# Patient Record
Sex: Female | Born: 1970 | Race: Black or African American | Hispanic: No | Marital: Married | Smoking: Never smoker
Health system: Southern US, Community
[De-identification: ages and names within clinical notes are randomized; demographics above are authoritative.]

## PROBLEM LIST (undated history)

## (undated) DIAGNOSIS — I10 Essential (primary) hypertension: Secondary | ICD-10-CM

## (undated) HISTORY — PX: UTERINE FIBROID SURGERY: SHX826

---

## 2012-10-30 ENCOUNTER — Emergency Department: Payer: Self-pay | Admitting: Emergency Medicine

## 2014-05-28 DIAGNOSIS — R5383 Other fatigue: Secondary | ICD-10-CM | POA: Insufficient documentation

## 2014-05-28 DIAGNOSIS — R42 Dizziness and giddiness: Secondary | ICD-10-CM | POA: Insufficient documentation

## 2014-05-28 DIAGNOSIS — H8113 Benign paroxysmal vertigo, bilateral: Secondary | ICD-10-CM | POA: Insufficient documentation

## 2014-05-28 DIAGNOSIS — G44309 Post-traumatic headache, unspecified, not intractable: Secondary | ICD-10-CM | POA: Insufficient documentation

## 2014-07-10 ENCOUNTER — Ambulatory Visit: Payer: Managed Care, Other (non HMO) | Admitting: Physical Therapy

## 2014-07-28 ENCOUNTER — Encounter: Payer: Self-pay | Admitting: Physical Therapy

## 2018-01-16 ENCOUNTER — Encounter: Payer: Self-pay | Admitting: Emergency Medicine

## 2018-01-16 ENCOUNTER — Emergency Department: Payer: BLUE CROSS/BLUE SHIELD

## 2018-01-16 ENCOUNTER — Emergency Department
Admission: EM | Admit: 2018-01-16 | Discharge: 2018-01-16 | Disposition: A | Discharge status: Home / Self Care | Payer: BLUE CROSS/BLUE SHIELD | Attending: Emergency Medicine | Admitting: Emergency Medicine

## 2018-01-16 DIAGNOSIS — J069 Acute upper respiratory infection, unspecified: Secondary | ICD-10-CM

## 2018-01-16 DIAGNOSIS — Z79899 Other long term (current) drug therapy: Secondary | ICD-10-CM | POA: Diagnosis not present

## 2018-01-16 DIAGNOSIS — R0602 Shortness of breath: Secondary | ICD-10-CM | POA: Diagnosis present

## 2018-01-16 LAB — COMPREHENSIVE METABOLIC PANEL
ALT: 16 U/L (ref 0–44)
AST: 21 U/L (ref 15–41)
Albumin: 3.7 g/dL (ref 3.5–5.0)
Alkaline Phosphatase: 83 U/L (ref 38–126)
Anion gap: 8 (ref 5–15)
BUN: 9 mg/dL (ref 6–20)
CO2: 23 mmol/L (ref 22–32)
Calcium: 8.5 mg/dL — ABNORMAL LOW (ref 8.9–10.3)
Chloride: 105 mmol/L (ref 98–111)
Creatinine, Ser: 0.64 mg/dL (ref 0.44–1.00)
GFR calc Af Amer: >60 mL/min (ref >60)
GFR calc non Af Amer: >60 mL/min (ref >60)
Glucose, Bld: 112 mg/dL — ABNORMAL HIGH (ref 70–99)
Potassium: 3.5 mmol/L (ref 3.5–5.1)
SODIUM: 136 mmol/L (ref 135–145)
Total Bilirubin: 0.5 mg/dL (ref 0.3–1.2)
Total Protein: 7.9 g/dL (ref 6.5–8.1)

## 2018-01-16 LAB — CBC
HCT: 31.8 % — ABNORMAL LOW (ref 36.0–46.0)
Hemoglobin: 9.7 g/dL — ABNORMAL LOW (ref 12.0–15.0)
MCH: 20 pg — ABNORMAL LOW (ref 26.0–34.0)
MCHC: 30.5 g/dL (ref 30.0–36.0)
MCV: 65.7 fL — ABNORMAL LOW (ref 80.0–100.0)
Platelets: 340 10*3/uL (ref 150–400)
RBC: 4.84 MIL/uL (ref 3.87–5.11)
RDW: 18.2 % — ABNORMAL HIGH (ref 11.5–15.5)
WBC: 8 10*3/uL (ref 4.0–10.5)
nRBC: 0 % (ref 0.0–0.2)

## 2018-01-16 MED ORDER — IPRATROPIUM-ALBUTEROL 0.5-2.5 (3) MG/3ML IN SOLN
3.0000 mL | Freq: Once | RESPIRATORY_TRACT | Status: AC
  Administered 2018-01-16: 3 mL via RESPIRATORY_TRACT
  Filled 2018-01-16: qty 3

## 2018-01-16 MED ORDER — PREDNISONE 50 MG PO TABS: 50.0000 mg | ORAL_TABLET | Freq: Every day | ORAL | 0 refills | Status: DC

## 2018-01-16 NOTE — ED Triage Notes (Signed)
Pt c/o SHOB for past 3 weeks that worst when laying flat. Denies any swelling or pain.  Does have a coarse breath sound noted to posterior left upper lobe.  Unlabored currently.  Some DOE.  Has had some drainage dripping down per pt.  No fevers.  Has had problem in past and needed nebulizer here per pt.

## 2018-01-16 NOTE — ED Notes (Signed)
Patient ambulatory to xray at this time.

## 2018-01-16 NOTE — ED Notes (Signed)
Patient states she has felt short of breath for 2 weeks, caused by drainage to the back of her throat.  Patient states she has had a cough but it is not productive.  Patient reports high anxiety and frequent panic attacks, particularly caused by the feeling she can't breathe.

## 2018-01-16 NOTE — ED Provider Notes (Signed)
[facility] Emergency Department Provider Note   ____________________________________________    I have reviewed the triage vital signs and the nursing notes.   HISTORY  Chief Complaint Shortness of Breath     HPI Martha Decker is a 48 y.o. female who presents with complaints of shortness of breath.  Patient reports she is having nasal drainage that she feels is "clogging her throat ".  She reports she had to sit up multiple times last night because difficulty breathing.  She has had cough for nearly a week.  No chest pain.  Feels much better now.  No calf pain swelling.  No diaphoresis nausea or vomiting.  She reports she has been using a dehumidifier  No past medical history on file.  There are no active problems to display for this patient.     Prior to Admission medications   Medication Sig Start Date End Date Taking? Authorizing Provider  albuterol (VENTOLIN HFA) 108 (90 Base) MCG/ACT inhaler Inhale 1 puff into the lungs every 6 (six) hours as needed. 03/23/14  Yes [provider]  Fexofenadine-Pseudoephedrine (ALLEGRA-D 24 HOUR PO) Take 1 tablet by mouth daily.   Yes [provider]  naproxen (NAPROSYN) 500 MG tablet Take 500 mg by mouth 2 (two) times daily with a meal. 01/02/18  Yes [provider]  Pseudoephedrine-Guaifenesin [phone] MG TB12 Take 1 tablet by mouth every 12 (twelve) hours.   Yes [provider]  predniSONE (DELTASONE) 50 MG tablet Take 1 tablet (50 mg total) by mouth daily with breakfast. 01/16/18   Lavonia Drafts, MD     Allergies Patient has no known allergies.  No family history on file.  Social History Social History   Tobacco Use  . Smoking status: Never Smoker  . Smokeless tobacco: Never Used  Substance Use Topics  . Alcohol use: Never    Frequency: Never  . Drug use: Never    Review of Systems  Constitutional: No fever/chills Eyes: No visual changes.  ENT: As  above Cardiovascular: Denies chest pain. Respiratory: As above Gastrointestinal: No abdominal pain.   Genitourinary: Negative for dysuria. Musculoskeletal: Negative for back pain. Skin: Negative for rash. Neurological: Negative for headaches or weakness   ____________________________________________   PHYSICAL EXAM:  VITAL SIGNS: ED Triage Vitals  Enc Vitals Group     BP 01/16/18 0952 (!) 149/105     Pulse Rate 01/16/18 0952 83     Resp 01/16/18 0952 20     Temp 01/16/18 0952 98.3 F (36.8 C)     Temp Source 01/16/18 0952 Oral     SpO2 01/16/18 0952 100 %     Weight 01/16/18 0951 99.8 kg (220 lb)     Height 01/16/18 0951 1.626 m (5\' 4" )     Head Circumference --      Peak Flow --      Pain Score 01/16/18 0954 0     Pain Loc --      Pain Edu? --      Excl. in [place]? --     Constitutional: Alert and oriented.  Eyes: Conjunctivae are normal.   Nose: No congestion/rhinnorhea. Mouth/Throat: Mucous membranes are moist.    Cardiovascular: Normal rate, regular rhythm. Grossly normal heart sounds.  Good peripheral circulation. Respiratory: Normal respiratory effort.  No retractions.  Gastrointestinal: Soft and nontender. No distention.    Musculoskeletal: No lower extremity tenderness nor edema.  Warm and well perfused Neurologic:  Normal speech and language. No gross  focal neurologic deficits are appreciated.  Skin:  Skin is warm, dry and intact. No rash noted. Psychiatric: Mood and affect are normal. Speech and behavior are normal.  ____________________________________________   LABS (all labs ordered are listed, but only abnormal results are displayed)  Labs Reviewed  CBC - Abnormal; Notable for the following components:      Result Value   Hemoglobin 9.7 (*)    HCT 31.8 (*)    MCV 65.7 (*)    MCH 20.0 (*)    RDW 18.2 (*)    All other components within normal limits  COMPREHENSIVE METABOLIC PANEL - Abnormal; Notable for the following components:   Glucose, Bld  112 (*)    Calcium 8.5 (*)    All other components within normal limits   ____________________________________________  EKG  ED ECG REPORT I, Lavonia Drafts, the attending physician, personally viewed and interpreted this ECG.  Date: 01/16/2018  Rhythm: normal sinus rhythm QRS Axis: normal Intervals: normal ST/T Wave abnormalities: normal Narrative Interpretation: no evidence of acute ischemia  ____________________________________________  RADIOLOGY  Chest x-ray unremarkable ____________________________________________   PROCEDURES  Procedure(s) performed: No  Procedures   Critical Care performed: No ____________________________________________   INITIAL IMPRESSION / ASSESSMENT AND PLAN / ED COURSE  Pertinent labs & imaging results that were available during my care of the patient were reviewed by me and considered in my medical decision making (see chart for details).  Patient presents with likely nasal/sinus drainage causing her shortness of breath while lying supine.  She is much better now.  Strongly recommended using a humidifier as opposed to a dehumidifier, will give steroids that she reports this is helped her in the past.    ____________________________________________   FINAL CLINICAL IMPRESSION(S) / ED DIAGNOSES  Final diagnoses:  Viral upper respiratory tract infection        Note:  This document was prepared using Dragon voice recognition software and may include unintentional dictation errors.   Lavonia Drafts, MD 01/16/18 1540

## 2018-07-15 ENCOUNTER — Emergency Department
Admission: EM | Admit: 2018-07-15 | Discharge: 2018-07-15 | Disposition: A | Discharge status: Home / Self Care | Payer: BC Managed Care – PPO | Attending: Emergency Medicine | Admitting: Emergency Medicine

## 2018-07-15 ENCOUNTER — Emergency Department: Payer: BC Managed Care – PPO

## 2018-07-15 ENCOUNTER — Encounter: Payer: Self-pay | Admitting: Emergency Medicine

## 2018-07-15 ENCOUNTER — Other Ambulatory Visit: Payer: Self-pay

## 2018-07-15 DIAGNOSIS — R110 Nausea: Secondary | ICD-10-CM | POA: Diagnosis not present

## 2018-07-15 DIAGNOSIS — N390 Urinary tract infection, site not specified: Secondary | ICD-10-CM | POA: Insufficient documentation

## 2018-07-15 DIAGNOSIS — I10 Essential (primary) hypertension: Secondary | ICD-10-CM | POA: Insufficient documentation

## 2018-07-15 DIAGNOSIS — H81399 Other peripheral vertigo, unspecified ear: Secondary | ICD-10-CM | POA: Diagnosis not present

## 2018-07-15 DIAGNOSIS — R079 Chest pain, unspecified: Secondary | ICD-10-CM | POA: Diagnosis not present

## 2018-07-15 DIAGNOSIS — R42 Dizziness and giddiness: Secondary | ICD-10-CM | POA: Diagnosis present

## 2018-07-15 HISTORY — DX: Essential (primary) hypertension: I10

## 2018-07-15 LAB — URINALYSIS, COMPLETE (UACMP) WITH MICROSCOPIC
Bacteria, UA: NONE SEEN
Bilirubin Urine: NEGATIVE
Glucose, UA: NEGATIVE mg/dL
Hgb urine dipstick: NEGATIVE
Ketones, ur: NEGATIVE mg/dL
Nitrite: NEGATIVE
Protein, ur: NEGATIVE mg/dL
Specific Gravity, Urine: 1.013 (ref 1.005–1.030)
pH: 7 (ref 5.0–8.0)

## 2018-07-15 LAB — TROPONIN I (HIGH SENSITIVITY): Troponin I (High Sensitivity): <2 ng/L (ref <18)

## 2018-07-15 LAB — CBC
HCT: 33.9 % — ABNORMAL LOW (ref 36.0–46.0)
Hemoglobin: 10.3 g/dL — ABNORMAL LOW (ref 12.0–15.0)
MCH: 20.5 pg — ABNORMAL LOW (ref 26.0–34.0)
MCHC: 30.4 g/dL (ref 30.0–36.0)
MCV: 67.5 fL — ABNORMAL LOW (ref 80.0–100.0)
Platelets: 356 10*3/uL (ref 150–400)
RBC: 5.02 MIL/uL (ref 3.87–5.11)
RDW: 19.8 % — ABNORMAL HIGH (ref 11.5–15.5)
WBC: 6.8 10*3/uL (ref 4.0–10.5)
nRBC: 0 % (ref 0.0–0.2)

## 2018-07-15 LAB — BASIC METABOLIC PANEL
Anion gap: 7 (ref 5–15)
BUN: 10 mg/dL (ref 6–20)
CO2: 24 mmol/L (ref 22–32)
Calcium: 8.9 mg/dL (ref 8.9–10.3)
Chloride: 106 mmol/L (ref 98–111)
Creatinine, Ser: 0.58 mg/dL (ref 0.44–1.00)
GFR calc Af Amer: >60 mL/min (ref >60)
GFR calc non Af Amer: >60 mL/min (ref >60)
Glucose, Bld: 131 mg/dL — ABNORMAL HIGH (ref 70–99)
Potassium: 3.4 mmol/L — ABNORMAL LOW (ref 3.5–5.1)
Sodium: 137 mmol/L (ref 135–145)

## 2018-07-15 LAB — PREGNANCY, URINE: Preg Test, Ur: NEGATIVE

## 2018-07-15 MED ORDER — CEPHALEXIN 500 MG PO CAPS: 500.0000 mg | ORAL_CAPSULE | Freq: Two times a day (BID) | ORAL | 0 refills | Status: AC

## 2018-07-15 MED ORDER — MECLIZINE HCL 25 MG PO TABS: 25.0000 mg | ORAL_TABLET | Freq: Three times a day (TID) | ORAL | 0 refills | Status: AC | PRN

## 2018-07-15 NOTE — Discharge Instructions (Signed)
Take the Keflex (antibiotic) as prescribed for the next week for the urinary tract infection.  You can take the meclizine as needed for the dizziness symptoms.  Return to the ER for new, worsening, persistent severe dizziness, headache, weakness or numbness, difficulty with speech or with walking, vision changes, high fever, vomiting, worsening urinary symptoms or any other new or worsening symptoms that concern you.

## 2018-07-15 NOTE — ED Provider Notes (Signed)
[facility] Emergency Department Provider Note ____________________________________________   First MD Initiated Contact with Patient 07/15/18 1514     (approximate)  I have reviewed the triage vital signs and the nursing notes.   HISTORY  Chief Complaint Dizziness and Chest Pain    HPI Martha Decker is a 48 y.o. female with PMH as noted below who presents with dizziness over approximately last 2 weeks, intermittent, mainly occurring in the morning when she gets up, and when she changes positions during the night.  It is worse with turning her head to the right.  It is described as spinning and is associated with nausea.  It subsides after a few minutes.  Patient denies any prior history of this and has not taken anything for it.  In addition over the last few days she has had some intermittent pain to her left upper chest which is also positional and relieved when lying on her left side.  She denies associated shortness of breath, leg swelling, fever, cough, or vomiting.  She does report some right lower back pain but no urinary symptoms.  Past Medical History:  Diagnosis Date  . Hypertension     There are no active problems to display for this patient.   Past Surgical History:  Procedure Laterality Date  . UTERINE FIBROID SURGERY      Prior to Admission medications   Medication Sig Start Date End Date Taking? Authorizing Provider  albuterol (VENTOLIN HFA) 108 (90 Base) MCG/ACT inhaler Inhale 1 puff into the lungs every 6 (six) hours as needed. 03/23/14   [provider]  cephALEXin (KEFLEX) 500 MG capsule Take 1 capsule (500 mg total) by mouth 2 (two) times daily for 7 days. 07/15/18 07/22/18  Arta Silence, MD  Fexofenadine-Pseudoephedrine (ALLEGRA-D 24 HOUR PO) Take 1 tablet by mouth daily.    [provider]  meclizine (ANTIVERT) 25 MG tablet Take 1 tablet (25 mg total) by mouth 3 (three) times daily as needed for dizziness.  07/15/18   Arta Silence, MD  naproxen (NAPROSYN) 500 MG tablet Take 500 mg by mouth 2 (two) times daily with a meal. 01/02/18   [provider]  predniSONE (DELTASONE) 50 MG tablet Take 1 tablet (50 mg total) by mouth daily with breakfast. 01/16/18   Lavonia Drafts, MD  Pseudoephedrine-Guaifenesin [phone] MG TB12 Take 1 tablet by mouth every 12 (twelve) hours.    [provider]    Allergies Patient has no known allergies.  No family history on file.  Social History Social History   Tobacco Use  . Smoking status: Never Smoker  . Smokeless tobacco: Never Used  Substance Use Topics  . Alcohol use: Never    Frequency: Never  . Drug use: Never    Review of Systems  Constitutional: No fever/chills. Eyes: No redness. ENT: No sore throat. Cardiovascular: Positive for chest pain. Respiratory: Denies shortness of breath. Gastrointestinal: No vomiting or diarrhea. Genitourinary: Positive for dysuria.  Musculoskeletal: Positive for back pain. Skin: Negative for rash. Neurological: Negative for headache.   ____________________________________________   PHYSICAL EXAM:  VITAL SIGNS: ED Triage Vitals [07/15/18 1337]  Enc Vitals Group     BP (!) 158/88     Pulse Rate 89     Resp 18     Temp 98.1 F (36.7 C)     Temp Source Oral     SpO2 99 %     Weight 228 lb (103.4 kg)     Height 5\' 4"  (  1.626 m)     Head Circumference      Peak Flow      Pain Score 8     Pain Loc      Pain Edu?      Excl. in [place]?     Constitutional: Alert and oriented. Well appearing and in no acute distress. Eyes: Conjunctivae are normal.  EOMI.  PERRLA. Head: Atraumatic.  Bilateral TMs normal. Nose: No congestion/rhinnorhea. Mouth/Throat: Mucous membranes are moist.   Neck: Normal range of motion.  Cardiovascular: Normal rate, regular rhythm. Grossly normal heart sounds.  Good peripheral circulation. Respiratory: Normal respiratory effort.  No retractions. Lungs CTAB.  Gastrointestinal: No distention.  Genitourinary: No CVA tenderness. Musculoskeletal: No lower extremity edema.  Extremities warm and well perfused.  Neurologic:  Normal speech and language.  Motor and sensory intact in all extremities.  Normal coordination with no ataxia on finger-to-nose.  No pronator drift.  Cranial nerves grossly intact. Skin:  Skin is warm and dry. No rash noted. Psychiatric: Mood and affect are normal. Speech and behavior are normal.  ____________________________________________   LABS (all labs ordered are listed, but only abnormal results are displayed)  Labs Reviewed  BASIC METABOLIC PANEL - Abnormal; Notable for the following components:      Result Value   Potassium 3.4 (*)    Glucose, Bld 131 (*)    All other components within normal limits  CBC - Abnormal; Notable for the following components:   Hemoglobin 10.3 (*)    HCT 33.9 (*)    MCV 67.5 (*)    MCH 20.5 (*)    RDW 19.8 (*)    All other components within normal limits  URINALYSIS, COMPLETE (UACMP) WITH MICROSCOPIC - Abnormal; Notable for the following components:   Color, Urine YELLOW (*)    APPearance HAZY (*)    Leukocytes,Ua MODERATE (*)    All other components within normal limits  TROPONIN I (HIGH SENSITIVITY)  PREGNANCY, URINE  TROPONIN I (HIGH SENSITIVITY)   ____________________________________________  EKG  ED ECG REPORT I, Arta Silence, the attending physician, personally viewed and interpreted this ECG.  Date: 07/15/2018 EKG Time: 1333 Rate: 91 Rhythm: normal sinus rhythm QRS Axis: Left axis Intervals: normal ST/T Wave abnormalities: normal Narrative Interpretation: no evidence of acute ischemia  ____________________________________________  RADIOLOGY  CXR: No focal infiltrate or other acute abnormality ____________________________________________   PROCEDURES  Procedure(s) performed: No  Procedures  Critical Care performed: No  ____________________________________________   INITIAL IMPRESSION / ASSESSMENT AND PLAN / ED COURSE  Pertinent labs & imaging results that were available during my care of the patient were reviewed by me and considered in my medical decision making (see chart for details).  48 year old female with a history of hypertension presents with intermittent dizziness over the last 2 weeks which is positional and is described as spinning.  It is associated with nausea, and extinguishes after a few minutes on its own.  In addition she has had mild and positional left-sided chest discomfort over the last several days.  It has no significant associated symptoms.  She has some mild right lower back pain and dysuria.  On exam she is well-appearing and her vital signs are normal except for hypertension.  The physical exam is unremarkable.  Neurologic exam is nonfocal.  She currently has no symptoms.  Initial lab work-up obtained from triage is unremarkable except for anemia which appears to be chronic.  EKG and troponin are both within normal limits.  The chest x-ray  is normal.  The dizziness is consistent with peripheral vertigo given that it is episodic, positional, and relatively intense when it happens.  There is no evidence of central etiology or indication for neurologic imaging.  The chest pain is very atypical and consistent with musculoskeletal etiology.  Given the normal EKG and troponin, there is no indication for further emergent work-up.  We will obtain a UA given the back pain and mild dysuria and plan for discharge home.  ----------------------------------------- 5:10 PM on 07/15/2018 -----------------------------------------  UA is consistent with possible UTI so we will treat the patient with Keflex.  She has been tolerating p.o. and there is no evidence for significant pyelonephritis or systemic infection.  She is stable for discharge at this time.  Return precautions given, and she  expresses understanding. ____________________________________________   FINAL CLINICAL IMPRESSION(S) / ED DIAGNOSES  Final diagnoses:  Peripheral vertigo, unspecified laterality  Urinary tract infection without hematuria, site unspecified      NEW MEDICATIONS STARTED DURING THIS VISIT:  New Prescriptions   CEPHALEXIN (KEFLEX) 500 MG CAPSULE    Take 1 capsule (500 mg total) by mouth 2 (two) times daily for 7 days.   MECLIZINE (ANTIVERT) 25 MG TABLET    Take 1 tablet (25 mg total) by mouth 3 (three) times daily as needed for dizziness.     Note:  This document was prepared using Dragon voice recognition software and may include unintentional dictation errors.    Arta Silence, MD 07/15/18 [phone]

## 2018-07-15 NOTE — ED Triage Notes (Signed)
Pt reports intermittent episodes of dizziness when she changes position or when she turns to the right. Pt also reports chest tightness. Pt reports symptoms started 2 weeks prior. Pt a&o x 4 with no deficits in triage.

## 2018-07-17 ENCOUNTER — Ambulatory Visit: Payer: Self-pay | Admitting: *Deleted

## 2018-07-17 NOTE — Telephone Encounter (Signed)
Summary: EKG results    Pt would like some clarity on her EKG results from [facility]. She received them on Mychart but does not understand if it means something is wrong or not/ please advise      Patient is calling with concerns about the "abnormal" she saw on her EKG report. Advised patient to contact her PCP for review of report and they will make the proper referrals and decisions if she needs further follow up. Patient states she will do that.   Reason for Disposition . [1] Follow-up call from patient regarding patient's clinical status AND [2] information NON-URGENT  Answer Assessment - Initial Assessment Questions 1. REASON FOR CALL or QUESTION: "What is your reason for calling today?" or "How can I best help you?" or "What question do you have that I can help answer?"     Patient is concerned about recent EKG report from ED visit 2. CALLER: Document the source of call. (e.g., laboratory, patient).     patient  Protocols used: PCP CALL - NO TRIAGE-A-AH

## 2018-08-27 DIAGNOSIS — D649 Anemia, unspecified: Secondary | ICD-10-CM | POA: Insufficient documentation

## 2018-11-27 DIAGNOSIS — I10 Essential (primary) hypertension: Secondary | ICD-10-CM | POA: Insufficient documentation

## 2019-02-25 ENCOUNTER — Encounter: Payer: Self-pay | Admitting: Obstetrics and Gynecology

## 2019-03-04 ENCOUNTER — Encounter: Payer: Self-pay | Admitting: Obstetrics and Gynecology

## 2019-03-17 ENCOUNTER — Encounter: Payer: Self-pay | Admitting: Obstetrics and Gynecology

## 2019-04-03 ENCOUNTER — Encounter: Payer: Self-pay | Admitting: Obstetrics and Gynecology

## 2019-04-22 ENCOUNTER — Other Ambulatory Visit (HOSPITAL_COMMUNITY)
Admission: RE | Admit: 2019-04-22 | Discharge: 2019-04-22 | Disposition: A | Discharge status: Home / Self Care | Payer: BC Managed Care – PPO | Source: Ambulatory Visit | Attending: Obstetrics and Gynecology | Admitting: Obstetrics and Gynecology

## 2019-04-22 ENCOUNTER — Encounter: Payer: Self-pay | Admitting: Obstetrics and Gynecology

## 2019-04-22 ENCOUNTER — Ambulatory Visit (INDEPENDENT_AMBULATORY_CARE_PROVIDER_SITE_OTHER): Payer: BC Managed Care – PPO | Admitting: Obstetrics and Gynecology

## 2019-04-22 ENCOUNTER — Other Ambulatory Visit: Payer: Self-pay

## 2019-04-22 VITALS — BP 132/70 | Ht 64.0 in | Wt 256.0 lb

## 2019-04-22 DIAGNOSIS — N852 Hypertrophy of uterus: Secondary | ICD-10-CM

## 2019-04-22 DIAGNOSIS — Z124 Encounter for screening for malignant neoplasm of cervix: Secondary | ICD-10-CM | POA: Insufficient documentation

## 2019-04-22 DIAGNOSIS — Z01419 Encounter for gynecological examination (general) (routine) without abnormal findings: Secondary | ICD-10-CM | POA: Diagnosis not present

## 2019-04-22 DIAGNOSIS — Z1231 Encounter for screening mammogram for malignant neoplasm of breast: Secondary | ICD-10-CM

## 2019-04-22 DIAGNOSIS — Z1211 Encounter for screening for malignant neoplasm of colon: Secondary | ICD-10-CM

## 2019-04-22 NOTE — Patient Instructions (Signed)
[employer] [place] for Calcium and Vitamin D  Age (yr) Calcium Recommended Dietary Allowance (mg/day) Vitamin D Recommended Dietary Allowance (international units/day)  9-18 1,300 600  19-50 1,000 600  51-70 1,200 600  71 and older 1,200 800  Data from Institute of Medicine. Dietary reference intakes: calcium, vitamin D. [place], [place]: [employer]; 2011.    Exercising to Lose Weight Exercise is structured, repetitive physical activity to improve fitness and health. Getting regular exercise is important for everyone. It is especially important if you are overweight. Being overweight increases your risk of heart disease, stroke, diabetes, high blood pressure, and several types of cancer. Reducing your calorie intake and exercising can help you lose weight. Exercise is usually categorized as moderate or vigorous intensity. To lose weight, most people need to do a certain amount of moderate-intensity or vigorous-intensity exercise each week. Moderate-intensity exercise  Moderate-intensity exercise is any activity that gets you moving enough to burn at least three times more energy (calories) than if you were sitting. Examples of moderate exercise include:  Walking a mile in 15 minutes.  Doing light yard work.  Biking at an easy pace. Most people should get at least 150 minutes (2 hours and 30 minutes) a week of moderate-intensity exercise to maintain their body weight. Vigorous-intensity exercise Vigorous-intensity exercise is any activity that gets you moving enough to burn at least six times more calories than if you were sitting. When you exercise at this intensity, you should be working hard enough that you are not able to carry on a conversation. Examples of vigorous exercise include:  Running.  Playing a team sport, such as football, basketball, and soccer.  Jumping rope. Most people should get at least 75 minutes (1 hour  and 15 minutes) a week of vigorous-intensity exercise to maintain their body weight. How can exercise affect me? When you exercise enough to burn more calories than you eat, you lose weight. Exercise also reduces body fat and builds muscle. The more muscle you have, the more calories you burn. Exercise also:  Improves mood.  Reduces stress and tension.  Improves your overall fitness, flexibility, and endurance.  Increases bone strength. The amount of exercise you need to lose weight depends on:  Your age.  The type of exercise.  Any health conditions you have.  Your overall physical ability. Talk to your health care provider about how much exercise you need and what types of activities are safe for you. What actions can I take to lose weight? Nutrition   Make changes to your diet as told by your health care provider or diet and nutrition specialist (dietitian). This may include: ? Eating fewer calories. ? Eating more protein. ? Eating less unhealthy fats. ? Eating a diet that includes fresh fruits and vegetables, whole grains, low-fat dairy products, and lean protein. ? Avoiding foods with added fat, salt, and sugar.  Drink plenty of water while you exercise to prevent dehydration or heat stroke. Activity  Choose an activity that you enjoy and set realistic goals. Your health care provider can help you make an exercise plan that works for you.  Exercise at a moderate or vigorous intensity most days of the week. ? The intensity of exercise may vary from person to person. You can tell how intense a workout is for you by paying attention to your breathing and heartbeat. Most people will notice their breathing and heartbeat get faster with more intense exercise.  Do resistance training twice  each week, such as: ? Push-ups. ? Sit-ups. ? Lifting weights. ? Using resistance bands.  Getting short amounts of exercise can be just as helpful as long structured periods of exercise.  If you have trouble finding time to exercise, try to include exercise in your daily routine. ? Get up, stretch, and walk around every 30 minutes throughout the day. ? Go for a walk during your lunch break. ? Park your car farther away from your destination. ? If you take public transportation, get off one stop early and walk the rest of the way. ? Make phone calls while standing up and walking around. ? Take the stairs instead of elevators or escalators.  Wear comfortable clothes and shoes with good support.  Do not exercise so much that you hurt yourself, feel dizzy, or get very short of breath. Where to find more information  U.S. Department of Health and Human Services: [employer]  Centers for Disease Control and Prevention (CDC): http://www.wolf.info/ Contact a health care provider:  Before starting a new exercise program.  If you have questions or concerns about your weight.  If you have a medical problem that keeps you from exercising. Get help right away if you have any of the following while exercising:  Injury.  Dizziness.  Difficulty breathing or shortness of breath that does not go away when you stop exercising.  Chest pain.  Rapid heartbeat. Summary  Being overweight increases your risk of heart disease, stroke, diabetes, high blood pressure, and several types of cancer.  Losing weight happens when you burn more calories than you eat.  Reducing the amount of calories you eat in addition to getting regular moderate or vigorous exercise each week helps you lose weight. This information is not intended to replace advice given to you by your health care provider. Make sure you discuss any questions you have with your health care provider. Document Revised: 01/15/2017 Document Reviewed: 01/15/2017 Elsevier Patient Education  [place].  Budget-Friendly Healthy Eating There are many ways to save money at the grocery store and continue to eat healthy. You can be  successful if you:  Plan meals according to your budget.  Make a grocery list and only purchase food according to your grocery list.  Prepare food yourself. What are tips for following this plan?  Reading food labels  Compare food labels between brand name foods and the store brand. Often the nutritional value is the same, but the store brand is lower cost.  Look for products that do not have added sugar, fat, or salt (sodium). These often cost the same but are healthier for you. Products may be labeled as: ? Sugar-free. ? Nonfat. ? Low-fat. ? Sodium-free. ? Low-sodium.  Look for lean ground beef labeled as at least 92% lean and 8% fat. Shopping  Buy only the items on your grocery list and go only to the areas of the store that have the items on your list.  Use coupons only for foods and brands you normally buy. Avoid buying items you wouldn't normally buy simply because they are on sale.  Check online and in newspapers for weekly deals.  Buy healthy items from the bulk bins when available, such as herbs, spices, flour, pasta, nuts, and dried fruit.  Buy fruits and vegetables that are in season. Prices are usually lower on in-season produce.  Look at the unit price on the price tag. Use it to compare different brands and sizes to find out which item is the  best deal.  Choose healthy items that are often low-cost, such as carrots, potatoes, apples, bananas, and oranges. Dried or canned beans are a low-cost protein source.  Buy in bulk and freeze extra food. Items you can buy in bulk include meats, fish, poultry, frozen fruits, and frozen vegetables.  Avoid buying "ready-to-eat" foods, such as pre-cut fruits and vegetables and pre-made salads.  If possible, shop around to discover where you can find the best prices. Consider other retailers such as dollar stores, larger [employer], local fruit and vegetable stands, and farmers markets.  Do not shop when you are hungry.  If you shop while hungry, it may be hard to stick to your list and budget.  Resist impulse buying. Use your grocery list as your official plan for the week.  Buy a variety of vegetables and fruits by purchasing fresh, frozen, and canned items.  Look at the top and bottom shelves for deals. Foods at eye level (eye level of an adult or child) are usually more expensive.  Be efficient with your time when shopping. The more time you spend at the store, the more money you are likely to spend.  To save money when choosing more expensive foods like meats and dairy: ? Choose cheaper cuts of meat, such as bone-in chicken thighs and drumsticks instead of skinless and boneless chicken. When you are ready to prepare the chicken, you can remove the skin yourself to make it healthier. ? Choose lean meats like chicken or [place] instead of beef. ? Choose canned seafood, such as tuna, salmon, or sardines. ? Buy eggs as a low-cost source of protein. ? Buy dried beans and peas, such as lentils, split peas, or kidney beans instead of meats. Dried beans and peas are a good alternative source of protein. ? Buy the larger tubs of yogurt instead of individual-sized containers.  Choose water instead of sodas and other sweetened beverages.  Avoid buying chips, cookies, and other "junk food." These items are usually expensive and not healthy. Cooking  Make extra food and freeze the extras in meal-sized containers or in individual portions for fast meals and snacks.  Pre-cook on days when you have extra time to prepare meals in advance. You can keep these meals in the fridge or freezer and reheat for a quick meal.  When you come home from the grocery store, wash, peel, and cut fruits and vegetables so they are ready to use and eat. This will help reduce food waste. Meal planning  Do not eat out or get fast food. Prepare food at home.  Make a grocery list and make sure to bring it with you to the store. If you  have a smart phone, you could use your phone to create your shopping list.  Plan meals and snacks according to a grocery list and budget you create.  Use leftovers in your meal plan for the week.  Look for recipes where you can cook once and make enough food for two meals.  Include budget-friendly meals like stews, casseroles, and stir-fry dishes.  Try some meatless meals or try "no cook" meals like salads.  Make sure that half your plate is filled with fruits or vegetables. Choose from fresh, frozen, or canned fruits and vegetables. If eating canned, remember to rinse them before eating. This will remove any excess salt added for packaging. Summary  Eating healthy on a budget is possible if you plan your meals according to your budget, purchase according to your budget  and grocery list, and prepare food yourself.  Tips for buying more food on a limited budget include buying generic brands, using coupons only for foods you normally buy, and buying healthy items from the bulk bins when available.  Tips for buying cheaper food to replace expensive food include choosing cheaper, lean cuts of meat, and buying dried beans and peas. This information is not intended to replace advice given to you by your health care provider. Make sure you discuss any questions you have with your health care provider. Document Revised: 01/03/2017 Document Reviewed: 01/03/2017 Elsevier Patient Education  [place].

## 2019-04-22 NOTE — Progress Notes (Signed)
Gynecology Annual Exam  PCP: Baxter Hire, MD  Chief Complaint:  Chief Complaint  Patient presents with  . Gynecologic Exam    History of Present Illness: Patient is a 49 y.o. G3P0030 presents for annual exam. The patient has no complaints today.   LMP: Patient's last menstrual period was 03/31/2019 (approximate). Reports that she did not have a period  For several months and then had one month of heavy bleeding.  The patient is occasionally sexually active. She currently uses none for contraception. She denies dyspareunia.  The patient does not perform self breast exams.  There is no notable family history of breast or ovarian cancer in her family.   The patient has regular exercise: yes, has been making efforts to walk more and jump rope. She is active caring for her horses. She feels motivated to loose weight.   The patient denies current symptoms of depression.    Review of Systems: Review of Systems  Constitutional: Negative for chills, fever, malaise/fatigue and weight loss.  HENT: Negative for congestion, hearing loss and sinus pain.   Eyes: Negative for blurred vision and double vision.  Respiratory: Negative for cough, sputum production, shortness of breath and wheezing.   Cardiovascular: Negative for chest pain, palpitations, orthopnea and leg swelling.  Gastrointestinal: Negative for abdominal pain, constipation, diarrhea, nausea and vomiting.  Genitourinary: Negative for dysuria, flank pain, frequency, hematuria and urgency.  Musculoskeletal: Negative for back pain, falls and joint pain.  Skin: Negative for itching and rash.  Neurological: Negative for dizziness and headaches.  Psychiatric/Behavioral: Negative for depression, substance abuse and suicidal ideas. The patient is not nervous/anxious.     Past Medical History:  There are no problems to display for this patient.   Past Surgical History:  Past Surgical History:  Procedure Laterality Date  .  UTERINE FIBROID SURGERY      Gynecologic History:  Patient's last menstrual period was 03/31/2019 (approximate). Contraception: none Last Pap: Results were: more than 3 years ago  Last mammogram: never  Obstetric History: G3P0030  Family History:  Family History  Problem Relation Age of Onset  . Breast cancer Paternal Aunt 34  . Colon cancer Paternal Uncle 66    Social History:  Social History   Socioeconomic History  . Marital status: Married    Spouse name: Not on file  . Number of children: Not on file  . Years of education: Not on file  . Highest education level: Not on file  Occupational History  . Not on file  Tobacco Use  . Smoking status: Never Smoker  . Smokeless tobacco: Never Used  Substance and Sexual Activity  . Alcohol use: Never  . Drug use: Never  . Sexual activity: Yes    Birth control/protection: None  Other Topics Concern  . Not on file  Social History Narrative  . Not on file   Social Determinants of Health   Financial Resource Strain:   . Difficulty of Paying Living Expenses:   Food Insecurity:   . Worried About Charity fundraiser in the Last Year:   . Arboriculturist in the Last Year:   Transportation Needs:   . Film/video editor (Medical):   Marland Kitchen Lack of Transportation (Non-Medical):   Physical Activity:   . Days of Exercise per Week:   . Minutes of Exercise per Session:   Stress:   . Feeling of Stress :   Social Connections:   . Frequency of Communication with  Friends and Family:   . Frequency of Social Gatherings with Friends and Family:   . Attends Religious Services:   . Active Member of Clubs or Organizations:   . Attends Archivist Meetings:   Marland Kitchen Marital Status:   Intimate Partner Violence:   . Fear of Current or Ex-Partner:   . Emotionally Abused:   Marland Kitchen Physically Abused:   . Sexually Abused:     Allergies:  No Known Allergies  Medications: Prior to Admission medications   Medication Sig Start Date End  Date Taking? Authorizing Provider  albuterol (VENTOLIN HFA) 108 (90 Base) MCG/ACT inhaler Inhale 1 puff into the lungs every 6 (six) hours as needed. 03/23/14  Yes [provider]  Fexofenadine-Pseudoephedrine (ALLEGRA-D 24 HOUR PO) Take 1 tablet by mouth daily.   Yes [provider]  losartan (COZAAR) 25 MG tablet Take 25 mg by mouth daily. 03/25/19  Yes [provider]  meclizine (ANTIVERT) 25 MG tablet Take 1 tablet (25 mg total) by mouth 3 (three) times daily as needed for dizziness. 07/15/18  Yes Arta Silence, MD    Physical Exam Vitals: Blood pressure 132/70, height 5\' 4"  (1.626 m), weight 256 lb (116.1 kg), last menstrual period 03/31/2019.  General: NAD HEENT: normocephalic, anicteric Thyroid: no enlargement, no palpable nodules Pulmonary: No increased work of breathing, CTAB Cardiovascular: RRR, distal pulses 2+ Breast: Breast symmetrical, no tenderness, no palpable nodules or masses, no skin or nipple retraction present, no nipple discharge.  No axillary or supraclavicular lymphadenopathy. Abdomen: NABS, soft, non-tender, non-distended.  Umbilicus without lesions.  No hepatomegaly, splenomegaly or masses palpable. No evidence of hernia  Genitourinary:  External: Normal external female genitalia.  Normal urethral meatus, normal Bartholin's and Skene's glands.    Vagina: Normal vaginal mucosa, no evidence of prolapse.    Cervix: Grossly normal in appearance, no bleeding  Uterus: Enlarged, mobile, irregular contour.  No CMT  Adnexa: ovaries non-enlarged, no adnexal masses  Rectal: deferred  Lymphatic: no evidence of inguinal lymphadenopathy Extremities: no edema, erythema, or tenderness Neurologic: Grossly intact Psychiatric: mood appropriate, affect full  Female chaperone present for pelvic and breast  portions of the physical exam  Assessment: 49 y.o. G3P0030 routine annual exam  Plan: Problem List Items Addressed This Visit    None    Visit  Diagnoses    Cervical cancer screening    -  Primary   Relevant Orders   Cytology - PAP   Breast cancer screening by mammogram       Relevant Orders   MM DIGITAL SCREENING BILATERAL   Bulky or enlarged uterus       Relevant Orders   US PELVIS TRANSVAGINAL NON-OB (TV ONLY)   Colon cancer screening       Relevant Orders   Ambulatory referral to Gastroenterology      1) Mammogram - recommend yearly screening mammogram.  Mammogram Was ordered today  2) STI screening  was not offered and therefore not obtained  3) ASCCP guidelines and rational discussed.  Patient opts for every 5 years screening interval  4) Contraception - the patient is currently using  none.  She is happy with her current form of contraception and plans to continue  5) Colonoscopy -- Screening recommended starting at age 38 for average risk individuals, age 71 for individuals deemed at increased risk (including African Americans) and recommended to continue until age 64.  For patient age 29-85 individualized approach is recommended.  Gold standard screening is via colonoscopy, Cologuard  screening is an acceptable alternative for patient unwilling or unable to undergo colonoscopy.  "Colorectal cancer screening for average?risk adults: 2018 guideline update from the American Cancer Society"[place]: A Cancer Journal for Clinicians: Jun 14, 2016   6) Routine healthcare maintenance including cholesterol, diabetes screening discussed managed by PCP.  7) Enlarged fibroid uterus- will obtain US. Discussed management options with patient. Patient provided with a handout regarding uterine fibroids. She will follow up for and [place].   8) Return in about 2 weeks (around 05/06/2019) for GYN visit and [place].  Adrian Prows MD [facility] 04/22/2019 9:12 AM

## 2019-04-28 ENCOUNTER — Ambulatory Visit (INDEPENDENT_AMBULATORY_CARE_PROVIDER_SITE_OTHER): Payer: Self-pay | Admitting: Gastroenterology

## 2019-04-28 ENCOUNTER — Other Ambulatory Visit: Payer: Self-pay

## 2019-04-28 DIAGNOSIS — Z1211 Encounter for screening for malignant neoplasm of colon: Secondary | ICD-10-CM

## 2019-04-28 LAB — CYTOLOGY - PAP
Comment: NEGATIVE
Diagnosis: NEGATIVE
High risk HPV: NEGATIVE

## 2019-04-28 NOTE — Progress Notes (Signed)
Gastroenterology Pre-Procedure Review  Request Date: 07/04/19 Requesting Physician: Dr. Vicente Males  PATIENT REVIEW QUESTIONS: The patient responded to the following health history questions as indicated:    1. Are you having any GI issues? no 2. Do you have a personal history of Polyps? no 3. Do you have a family history of Colon Cancer or Polyps? yes (uncle colon cancer) 4. Diabetes Mellitus? no 5. Joint replacements in the past 12 months?no 6. Major health problems in the past 3 months?no 7. Any artificial heart valves, MVP, or defibrillator?no    MEDICATIONS & ALLERGIES:    Patient reports the following regarding taking any anticoagulation/antiplatelet therapy:   Plavix, Coumadin, Eliquis, Xarelto, Lovenox, Pradaxa, Brilinta, or Effient? no Aspirin? no  Patient confirms/reports the following medications:  Current Outpatient Medications  Medication Sig Dispense Refill  . albuterol (VENTOLIN HFA) 108 (90 Base) MCG/ACT inhaler Inhale 1 puff into the lungs every 6 (six) hours as needed.    Marland Kitchen Fexofenadine-Pseudoephedrine (ALLEGRA-D 24 HOUR PO) Take 1 tablet by mouth daily.    Marland Kitchen losartan (COZAAR) 25 MG tablet Take 25 mg by mouth daily.    . meclizine (ANTIVERT) 25 MG tablet Take 1 tablet (25 mg total) by mouth 3 (three) times daily as needed for dizziness. 30 tablet 0   No current facility-administered medications for this visit.    Patient confirms/reports the following allergies:  No Known Allergies  No orders of the defined types were placed in this encounter.   AUTHORIZATION INFORMATION Primary Insurance: 1D#: Group #:  Secondary Insurance: 1D#: Group #:  SCHEDULE INFORMATION: Date: 07/04/19 Time: Location:[facility]

## 2019-05-20 ENCOUNTER — Ambulatory Visit: Payer: BC Managed Care – PPO

## 2019-05-20 ENCOUNTER — Ambulatory Visit: Payer: BC Managed Care – PPO | Admitting: Obstetrics and Gynecology

## 2019-06-05 ENCOUNTER — Encounter: Payer: Self-pay | Admitting: Obstetrics and Gynecology

## 2019-06-05 ENCOUNTER — Other Ambulatory Visit: Payer: Self-pay

## 2019-06-05 ENCOUNTER — Ambulatory Visit (INDEPENDENT_AMBULATORY_CARE_PROVIDER_SITE_OTHER): Payer: BC Managed Care – PPO | Admitting: Obstetrics and Gynecology

## 2019-06-05 ENCOUNTER — Ambulatory Visit (INDEPENDENT_AMBULATORY_CARE_PROVIDER_SITE_OTHER): Payer: BC Managed Care – PPO

## 2019-06-05 ENCOUNTER — Other Ambulatory Visit: Payer: Self-pay | Admitting: Obstetrics and Gynecology

## 2019-06-05 VITALS — BP 148/100 | Ht 64.0 in | Wt 252.0 lb

## 2019-06-05 DIAGNOSIS — D251 Intramural leiomyoma of uterus: Secondary | ICD-10-CM | POA: Diagnosis not present

## 2019-06-05 DIAGNOSIS — N852 Hypertrophy of uterus: Secondary | ICD-10-CM

## 2019-06-05 DIAGNOSIS — D252 Subserosal leiomyoma of uterus: Secondary | ICD-10-CM | POA: Diagnosis not present

## 2019-06-05 DIAGNOSIS — D250 Submucous leiomyoma of uterus: Secondary | ICD-10-CM

## 2019-06-05 NOTE — Progress Notes (Signed)
Patient ID: Martha Decker, female   DOB: December 27, 1970, 49 y.o.   MRN: [id number]  Reason for Consult: Fibroids   Referred by Baxter Hire, MD  Subjective:     HPI:  Martha Decker is a 49 y.o. female she is following up today for menorrhagia and enlarged fibroid uterus.  Pelvic ultrasound confirms suspected fibroid uterus.  She has no additional or new complaints since her last visit.  Past Medical History:  Diagnosis Date  . Hypertension    Family History  Problem Relation Age of Onset  . Breast cancer Paternal Aunt 66  . Colon cancer Paternal Uncle 64   Past Surgical History:  Procedure Laterality Date  . UTERINE FIBROID SURGERY      Short Social History:  Social History   Tobacco Use  . Smoking status: Never Smoker  . Smokeless tobacco: Never Used  Substance Use Topics  . Alcohol use: Never    No Known Allergies  Current Outpatient Medications  Medication Sig Dispense Refill  . albuterol (VENTOLIN HFA) 108 (90 Base) MCG/ACT inhaler Inhale 1 puff into the lungs every 6 (six) hours as needed.    Marland Kitchen Fexofenadine-Pseudoephedrine (ALLEGRA-D 24 HOUR PO) Take 1 tablet by mouth daily.    Marland Kitchen losartan (COZAAR) 25 MG tablet Take 25 mg by mouth daily.    . meclizine (ANTIVERT) 25 MG tablet Take 1 tablet (25 mg total) by mouth 3 (three) times daily as needed for dizziness. 30 tablet 0   No current facility-administered medications for this visit.    Review of Systems  Constitutional: Negative for chills, fatigue, fever and unexpected weight change.  HENT: Negative for trouble swallowing.  Eyes: Negative for loss of vision.  Respiratory: Negative for cough, shortness of breath and wheezing.  Cardiovascular: Negative for chest pain, leg swelling, palpitations and syncope.  GI: Negative for abdominal pain, blood in stool, diarrhea, nausea and vomiting.  GU: Negative for difficulty urinating, dysuria, frequency and hematuria.  Musculoskeletal: Negative for back pain, leg  pain and joint pain.  Skin: Negative for rash.  Neurological: Negative for dizziness, headaches, light-headedness, numbness and seizures.  Psychiatric: Negative for behavioral problem, confusion, depressed mood and sleep disturbance.     Objective:  Objective   Vitals:   06/05/19 0940  BP: (!) 148/100  Weight: 252 lb (114.3 kg)  Height: 5\' 4"  (1.626 m)   Body mass index is 43.26 kg/m.  Physical Exam Vitals and nursing note reviewed.  Constitutional:      Appearance: She is well-developed.  HENT:     Head: Normocephalic and atraumatic.  Eyes:     Pupils: Pupils are equal, round, and reactive to light.  Cardiovascular:     Rate and Rhythm: Normal rate and regular rhythm.  Pulmonary:     Effort: Pulmonary effort is normal. No respiratory distress.  Skin:    General: Skin is warm and dry.  Neurological:     Mental Status: She is alert and oriented to person, place, and time.  Psychiatric:        Behavior: Behavior normal.        Thought Content: Thought content normal.        Judgment: Judgment normal.        Assessment/Plan:     49 year old with enlarged fibroid uterus and abnormal uterine bleeding. Discussed options for management of enlarged fibroid uterus such as uterine fibroid embolization, medical management, and hysterectomy.  Patient is most interested in this time and pursuing hysterectomy.  She  will follow up for an endometrial biopsy prior to the procedure.  She would like to plan this procedure for either the fall 2021 or early 2022.  We discussed the normal recovery process after hysterectomy as well as what to expect postoperatively.  More than 30 minutes were spent face to face with the patient in the room, reviewing the medical record, labs and images, and coordinating care for the patient. The plan of management was discussed in detail and counseling was provided.   Adrian Prows MD, [facility], [facility] 06/05/2019 10:17  AM

## 2019-06-05 NOTE — Patient Instructions (Signed)
Hysterectomy Information  A hysterectomy is a surgery in which the uterus is removed. The fallopian tubes and ovaries may be removed (bilateral salpingo-oophorectomy) as well. This procedure may be done to treat various medical problems. After the procedure, a woman will no longer have menstrual periods nor will she be able to become pregnant (sterile). What are the reasons for a hysterectomy? There are many reasons why a woman might have this procedure. They include:  Persistent, abnormal vaginal bleeding.  Long-term (chronic) pelvic pain or infection.  Endometriosis. This is when the lining of the uterus (endometrium) starts to grow outside the uterus.  Adenomyosis. This is when the endometrium starts to grow in the muscle of the uterus.  Pelvic organ prolapse. This is a condition in which the uterus falls down into the vagina.  Noncancerous growths in the uterus (uterine fibroids) that cause symptoms.  The presence of precancerous cells.  Cervical or uterine cancer. What are the different types of hysterectomy? There are three different types of hysterectomy:  Supracervical hysterectomy. In this type, the top part of the uterus is removed, but not the cervix.  Total hysterectomy. In this type, the uterus and cervix are removed.  Radical hysterectomy. In this type, the uterus, the cervix, and the tissue that holds the uterus in place (parametrium) are removed. What are the different ways a hysterectomy can be performed? There are many different ways a hysterectomy can be performed, including:  Abdominal hysterectomy. In this type, an incision is made in the abdomen. The uterus is removed through this incision.  Vaginal hysterectomy. In this type, an incision is made in the vagina. The uterus is removed through this incision. There are no abdominal incisions.  Conventional laparoscopic hysterectomy. In this type, three or four small incisions are made in the abdomen. A thin,  lighted tube with a camera (laparoscope) is inserted into one of the incisions. Other tools are put through the other incisions. The uterus is cut into small pieces. The small pieces are removed through the incisions or through the vagina.  Laparoscopically assisted vaginal hysterectomy (LAVH). In this type, three or four small incisions are made in the abdomen. Part of the surgery is performed laparoscopically and the other part is done vaginally. The uterus is removed through the vagina.  Robot-assisted laparoscopic hysterectomy. In this type, a laparoscope and other tools are inserted into three or four small incisions in the abdomen. A computer-controlled device is used to give the surgeon a 3D image and to help control the surgical instruments. This allows for more precise movements of surgical instruments. The uterus is cut into small pieces and removed through the incisions or removed through the vagina. Discuss the options with your health care provider to determine which type is the right one for you. What are the risks? Generally, this is a safe procedure. However, problems may occur, including:  Bleeding and risk of blood transfusion. Tell your health care provider if you do not want to receive any blood products.  Blood clots in the legs or lung.  Infection.  Damage to other structures or organs.  Allergic reactions to medicines.  Changing to an abdominal hysterectomy from one of the other techniques. What to expect after a hysterectomy  You will be given pain medicine.  You may need to stay in the hospital for 1- 2 days to recover, depending on the type of hysterectomy you had.  Follow your health care provider's instructions about exercise, driving, and general activities. Ask your   health care provider what activities are safe for you.  You will need to have someone with you for the first 3-5 days after you go home.  You will need to follow up with your surgeon in 2-4  weeks after surgery to evaluate your progress.  If the ovaries are removed, you will have early menopause symptoms such as hot flashes, night sweats, and insomnia.  If you had a hysterectomy for a problem that was not cancer or not a condition that could lead to cancer, then you no longer need Pap tests. However, even if you no longer need a Pap test, a regular pelvic exam is a good idea to make sure no other problems are developing. Questions to ask your health care provider  Is a hysterectomy medically necessary? Do I have other treatment options for my condition?  What are my options for hysterectomy procedure?  What organs and tissues need to be removed?  What are the risks?  What are the benefits?  How long will I need to stay in the hospital after the procedure?  How long will I need to recover at home?  What symptoms can I expect after the procedure? Summary  A hysterectomy is a surgery in which the uterus is removed. The fallopian tubes and ovaries may be removed (bilateral salpingo-oophorectomy) as well.  This procedure may be done to treat various medical problems. After the procedure, a woman will no longer have menstrual periods nor will she be able to become pregnant.  Discuss the options with your health care provider to determine which type of hysterectomy is the right one for you. This information is not intended to replace advice given to you by your health care provider. Make sure you discuss any questions you have with your health care provider. Document Revised: 12/15/2016 Document Reviewed: 02/09/2016 Elsevier Patient Education  2020 Elsevier Inc.  

## 2019-06-17 ENCOUNTER — Other Ambulatory Visit: Payer: Self-pay

## 2019-06-17 ENCOUNTER — Telehealth: Payer: Self-pay

## 2019-06-17 DIAGNOSIS — Z1211 Encounter for screening for malignant neoplasm of colon: Secondary | ICD-10-CM

## 2019-06-17 NOTE — Telephone Encounter (Signed)
Returned patients call to rescheduled colonoscopy date from 07/04/19 to 10/14/19 per patient request to reschedule to Fall.  New instructions sent via mychart.  Referral updated.  Trish in Endo has been notified of date change.  Thanks,  Gu [place], [place]

## 2019-08-06 ENCOUNTER — Ambulatory Visit: Payer: BC Managed Care – PPO | Admitting: Obstetrics and Gynecology

## 2019-10-02 ENCOUNTER — Telehealth: Payer: Self-pay

## 2019-10-02 ENCOUNTER — Other Ambulatory Visit: Payer: Self-pay

## 2019-10-02 NOTE — Telephone Encounter (Signed)
Patient requested to reschedule her colonoscopy. Called patient and offered several days that would work for her. Procedure has been rescheduled. Patient verbalized understanding.

## 2019-12-15 ENCOUNTER — Other Ambulatory Visit: Payer: Self-pay

## 2019-12-15 DIAGNOSIS — Z1211 Encounter for screening for malignant neoplasm of colon: Secondary | ICD-10-CM

## 2019-12-16 ENCOUNTER — Other Ambulatory Visit: Admission: RE | Admit: 2019-12-16 | Payer: BC Managed Care – PPO | Source: Ambulatory Visit

## 2019-12-18 ENCOUNTER — Ambulatory Visit
Admission: RE | Admit: 2019-12-18 | Payer: BC Managed Care – PPO | Source: Home / Self Care | Admitting: Gastroenterology

## 2019-12-18 ENCOUNTER — Encounter: Admission: RE | Payer: Self-pay | Source: Home / Self Care

## 2019-12-18 SURGERY — COLONOSCOPY WITH PROPOFOL
Anesthesia: General

## 2020-02-06 ENCOUNTER — Other Ambulatory Visit: Payer: Self-pay | Admitting: Internal Medicine

## 2020-02-06 DIAGNOSIS — Z1231 Encounter for screening mammogram for malignant neoplasm of breast: Secondary | ICD-10-CM

## 2020-02-25 ENCOUNTER — Other Ambulatory Visit
Admission: RE | Admit: 2020-02-25 | Discharge: 2020-02-25 | Disposition: A | Discharge status: Home / Self Care | Payer: BC Managed Care – PPO | Source: Ambulatory Visit | Attending: Gastroenterology | Admitting: Gastroenterology

## 2020-02-25 ENCOUNTER — Other Ambulatory Visit: Admission: RE | Admit: 2020-02-25 | Payer: BC Managed Care – PPO | Source: Ambulatory Visit

## 2020-02-25 ENCOUNTER — Telehealth: Payer: Self-pay | Admitting: Gastroenterology

## 2020-02-25 ENCOUNTER — Other Ambulatory Visit: Payer: Self-pay

## 2020-02-25 DIAGNOSIS — Z803 Family history of malignant neoplasm of breast: Secondary | ICD-10-CM | POA: Diagnosis not present

## 2020-02-25 DIAGNOSIS — Z20822 Contact with and (suspected) exposure to covid-19: Secondary | ICD-10-CM | POA: Insufficient documentation

## 2020-02-25 DIAGNOSIS — Z01812 Encounter for preprocedural laboratory examination: Secondary | ICD-10-CM | POA: Insufficient documentation

## 2020-02-25 DIAGNOSIS — Z79899 Other long term (current) drug therapy: Secondary | ICD-10-CM | POA: Diagnosis not present

## 2020-02-25 DIAGNOSIS — Z1211 Encounter for screening for malignant neoplasm of colon: Secondary | ICD-10-CM | POA: Diagnosis not present

## 2020-02-25 DIAGNOSIS — I10 Essential (primary) hypertension: Secondary | ICD-10-CM | POA: Diagnosis not present

## 2020-02-25 DIAGNOSIS — Z800 Family history of malignant neoplasm of digestive organs: Secondary | ICD-10-CM | POA: Diagnosis not present

## 2020-02-25 LAB — SARS CORONAVIRUS 2 (TAT 6-24 HRS): SARS Coronavirus 2: NEGATIVE

## 2020-02-25 NOTE — Telephone Encounter (Signed)
Please call patient to review prep instructions for Colonoscopy scheduled for 02/27/20

## 2020-02-26 ENCOUNTER — Encounter: Payer: Self-pay | Admitting: Gastroenterology

## 2020-02-27 ENCOUNTER — Ambulatory Visit: Payer: BC Managed Care – PPO | Admitting: Anesthesiology

## 2020-02-27 ENCOUNTER — Encounter
Admission: RE | Disposition: A | Discharge status: Home / Self Care | Payer: Self-pay | Source: Home / Self Care | Attending: Gastroenterology

## 2020-02-27 ENCOUNTER — Other Ambulatory Visit: Payer: Self-pay

## 2020-02-27 ENCOUNTER — Ambulatory Visit
Admission: RE | Admit: 2020-02-27 | Discharge: 2020-02-27 | Disposition: A | Discharge status: Home / Self Care | Payer: BC Managed Care – PPO | Attending: Gastroenterology | Admitting: Gastroenterology

## 2020-02-27 ENCOUNTER — Encounter: Payer: Self-pay | Admitting: Gastroenterology

## 2020-02-27 DIAGNOSIS — Z1211 Encounter for screening for malignant neoplasm of colon: Secondary | ICD-10-CM | POA: Diagnosis not present

## 2020-02-27 DIAGNOSIS — Z79899 Other long term (current) drug therapy: Secondary | ICD-10-CM | POA: Insufficient documentation

## 2020-02-27 DIAGNOSIS — Z800 Family history of malignant neoplasm of digestive organs: Secondary | ICD-10-CM | POA: Insufficient documentation

## 2020-02-27 DIAGNOSIS — I10 Essential (primary) hypertension: Secondary | ICD-10-CM | POA: Insufficient documentation

## 2020-02-27 DIAGNOSIS — Z803 Family history of malignant neoplasm of breast: Secondary | ICD-10-CM | POA: Insufficient documentation

## 2020-02-27 DIAGNOSIS — Z20822 Contact with and (suspected) exposure to covid-19: Secondary | ICD-10-CM | POA: Insufficient documentation

## 2020-02-27 HISTORY — PX: COLONOSCOPY WITH PROPOFOL: SHX5780

## 2020-02-27 LAB — POCT PREGNANCY, URINE: Preg Test, Ur: NEGATIVE

## 2020-02-27 SURGERY — COLONOSCOPY WITH PROPOFOL
Anesthesia: General

## 2020-02-27 MED ORDER — PROPOFOL 500 MG/50ML IV EMUL
INTRAVENOUS | Status: AC
  Filled 2020-02-27: qty 50

## 2020-02-27 MED ORDER — PROPOFOL 10 MG/ML IV BOLUS
INTRAVENOUS | Status: AC
  Filled 2020-02-27: qty 20

## 2020-02-27 MED ORDER — PROPOFOL 10 MG/ML IV BOLUS
INTRAVENOUS | Status: DC | PRN
  Administered 2020-02-27: 30 mg via INTRAVENOUS
  Administered 2020-02-27: 50 mg via INTRAVENOUS

## 2020-02-27 MED ORDER — SODIUM CHLORIDE 0.9 % IV SOLN
INTRAVENOUS | Status: DC
  Administered 2020-02-27: 1000 mL via INTRAVENOUS

## 2020-02-27 MED ORDER — LIDOCAINE HCL (PF) 2 % IJ SOLN
INTRAMUSCULAR | Status: AC
  Filled 2020-02-27: qty 5

## 2020-02-27 MED ORDER — LIDOCAINE HCL (CARDIAC) PF 100 MG/5ML IV SOSY
PREFILLED_SYRINGE | INTRAVENOUS | Status: DC | PRN
  Administered 2020-02-27: 90 mg via INTRAVENOUS

## 2020-02-27 MED ORDER — PROPOFOL 500 MG/50ML IV EMUL
INTRAVENOUS | Status: DC | PRN
  Administered 2020-02-27: 180 ug/kg/min via INTRAVENOUS

## 2020-02-27 NOTE — Anesthesia Postprocedure Evaluation (Signed)
Anesthesia Post Note  Patient: Martha Decker  Procedure(s) Performed: COLONOSCOPY WITH PROPOFOL (N/A )  Patient location during evaluation: Endoscopy Anesthesia Type: General Level of consciousness: awake and alert Pain management: pain level controlled Vital Signs Assessment: post-procedure vital signs reviewed and stable Respiratory status: spontaneous breathing, nonlabored ventilation, respiratory function stable and patient connected to nasal cannula oxygen Cardiovascular status: blood pressure returned to baseline and stable Postop Assessment: no apparent nausea or vomiting Anesthetic complications: no   No complications documented.   Last Vitals:  Vitals:   02/27/20 0719 02/27/20 0845  BP: (!) 131/102 (!) 146/86  Pulse: 80   Resp: 18   Temp: 36.7 C (!) 36.3 C  SpO2: 100%     Last Pain:  Vitals:   02/27/20 0855  TempSrc:   PainSc: 0-No pain                 Arita Miss

## 2020-02-27 NOTE — H&P (Signed)
     Jonathon Bellows, MD [address], [place], [place], [place], [address] [id number], [place], [place], [place], [address] Phone: [phone]  Fax: [phone]  Primary Care Physician:  Baxter Hire, MD   Pre-Procedure History & Physical: HPI:  Martha Decker is a 50 y.o. female is here for an colonoscopy.   Past Medical History:  Diagnosis Date  . Hypertension     Past Surgical History:  Procedure Laterality Date  . UTERINE FIBROID SURGERY      Prior to Admission medications   Medication Sig Start Date End Date Taking? Authorizing Provider  losartan (COZAAR) 25 MG tablet Take 25 mg by mouth daily. 03/25/19  Yes [provider]  albuterol (VENTOLIN HFA) 108 (90 Base) MCG/ACT inhaler Inhale 1 puff into the lungs every 6 (six) hours as needed. 03/23/14   [provider]  Fexofenadine-Pseudoephedrine (ALLEGRA-D 24 HOUR PO) Take 1 tablet by mouth daily. Patient not taking: Reported on 02/27/2020    [provider]  meclizine (ANTIVERT) 25 MG tablet Take 1 tablet (25 mg total) by mouth 3 (three) times daily as needed for dizziness. 07/15/18   Arta Silence, MD    Allergies as of 12/15/2019  . (No Known Allergies)    Family History  Problem Relation Age of Onset  . Breast cancer Paternal Aunt 43  . Colon cancer Paternal Uncle 61    Social History   Socioeconomic History  . Marital status: Married    Spouse name: Not on file  . Number of children: Not on file  . Years of education: Not on file  . Highest education level: Not on file  Occupational History  . Not on file  Tobacco Use  . Smoking status: Never Smoker  . Smokeless tobacco: Never Used  Vaping Use  . Vaping Use: Never used  Substance and Sexual Activity  . Alcohol use: Yes    Comment: wine on occas.  . Drug use: Never  . Sexual activity: Yes    Birth control/protection: None  Other Topics Concern  . Not on file  Social History Narrative  . Not on file   Social  Determinants of Health   Financial Resource Strain: Not on file  Food Insecurity: Not on file  Transportation Needs: Not on file  Physical Activity: Not on file  Stress: Not on file  Social Connections: Not on file  Intimate Partner Violence: Not on file    Review of Systems: See HPI, otherwise negative ROS  Physical Exam: BP (!) 131/102   Pulse 80   Temp 98.1 F (36.7 C) (Temporal)   Resp 18   Ht 5\' 4"  (1.626 m)   Wt 104.5 kg   SpO2 100%   BMI 39.56 kg/m  General:   Alert,  pleasant and cooperative in NAD Head:  Normocephalic and atraumatic. Neck:  Supple; no masses or thyromegaly. Lungs:  Clear throughout to auscultation, normal respiratory effort.    Heart:  +S1, +S2, Regular rate and rhythm, No edema. Abdomen:  Soft, nontender and nondistended. Normal bowel sounds, without guarding, and without rebound.   Neurologic:  Alert and  oriented x4;  grossly normal neurologically.  Impression/Plan: Martha Decker is here for an colonoscopy to be performed for Screening colonoscopy average risk   Risks, benefits, limitations, and alternatives regarding  colonoscopy have been reviewed with the patient.  Questions have been answered.  All parties agreeable.   Jonathon Bellows, MD  02/27/2020, 8:10 AM

## 2020-02-27 NOTE — Transfer of Care (Signed)
Immediate Anesthesia Transfer of Care Note  Patient: Martha Decker  Procedure(s) Performed: COLONOSCOPY WITH PROPOFOL (N/A )  Patient Location: PACU  Anesthesia Type:General  Level of Consciousness: awake, alert  and oriented  Airway & Oxygen Therapy: Patient Spontanous Breathing and Patient connected to nasal cannula oxygen  Post-op Assessment: Report given to RN and Post -op Vital signs reviewed and stable  Post vital signs: Reviewed and stable  Last Vitals:  Vitals Value Taken Time  BP 146/86 02/27/20 0846  Temp 36.3 C 02/27/20 0845  Pulse 86 02/27/20 0848  Resp 18 02/27/20 0848  SpO2 100 % 02/27/20 0848  Vitals shown include unvalidated device data.  Last Pain:  Vitals:   02/27/20 0845  TempSrc: Temporal  PainSc: 0-No pain         Complications: No complications documented.

## 2020-02-27 NOTE — Op Note (Signed)
[facility] Patient Name: Martha Decker Procedure Date: 02/27/2020 7:45 AM MRN: [id number] Account #: [id number] Date of Birth: 15-Dec-1970 Admit Type: Outpatient Age: 50 Room: [facility] ENDO ROOM 4 Gender: Female Note Status: Finalized Procedure:             Colonoscopy Indications:           Screening for colorectal malignant neoplasm Providers:             Jonathon Bellows MD, MD Referring MD:          Baxter Hire, MD (Referring MD) Medicines:             Monitored Anesthesia Care Complications:         No immediate complications. Procedure:             Pre-Anesthesia Assessment:                        - Prior to the procedure, a History and Physical was                         performed, and patient medications, allergies and                         sensitivities were reviewed. The patient's tolerance                         of previous anesthesia was reviewed.                        - The risks and benefits of the procedure and the                         sedation options and risks were discussed with the                         patient. All questions were answered and informed                         consent was obtained.                        - ASA Grade Assessment: II - A patient with mild                         systemic disease.                        After obtaining informed consent, the colonoscope was                         passed under direct vision. Throughout the procedure,                         the patient's blood pressure, pulse, and oxygen                         saturations were monitored continuously. The                         Colonoscope was introduced through the anus  and                         advanced to the the cecum, identified by the                         appendiceal orifice. The colonoscopy was performed                         with ease. The patient tolerated the procedure well.                         The  quality of the bowel preparation was good. Findings:      The perianal and digital rectal examinations were normal.      The entire examined colon appeared normal on direct and retroflexion       views. Impression:            - The entire examined colon is normal on direct and                         retroflexion views.                        - No specimens collected. Recommendation:        - Discharge patient to home (with escort).                        - Resume previous diet.                        - Continue present medications.                        - Repeat colonoscopy in 10 years for screening                         purposes. Procedure Code(s):     --- Professional ---                        [phone], Colonoscopy, flexible; diagnostic, including                         collection of specimen(s) by brushing or washing, when                         performed (separate procedure) Diagnosis Code(s):     --- Professional ---                        Z12.11, Encounter for screening for malignant neoplasm                         of colon CPT copyright 2019 American Medical Association. All rights reserved. The codes documented in this report are preliminary and upon coder review may  be revised to meet current compliance requirements. Jonathon Bellows, MD Jonathon Bellows MD, MD 02/27/2020 8:38:54 AM This report has been signed electronically. Number of Addenda: 0 Note Initiated On: 02/27/2020 7:45 AM Scope Withdrawal Time: 0 hours 16 minutes 0 seconds  Total Procedure Duration: 0 hours 18 minutes 14 seconds  Estimated Blood Loss:  Estimated blood loss: none.      [facility]

## 2020-02-27 NOTE — Anesthesia Preprocedure Evaluation (Signed)
Anesthesia Evaluation  Patient identified by MRN, date of birth, ID band Patient awake    Reviewed: Allergy & Precautions, NPO status , Patient's Chart, lab work & pertinent test results  History of Anesthesia Complications Negative for: history of anesthetic complications  Airway Mallampati: II  TM Distance: >3 FB Neck ROM: Full    Dental no notable dental hx. (+) Teeth Intact, Implants   Pulmonary neg pulmonary ROS, neg sleep apnea, neg COPD, Patient abstained from smoking.Not current smoker,    Pulmonary exam normal breath sounds clear to auscultation       Cardiovascular Exercise Tolerance: Good METShypertension, (-) CAD and (-) Past MI (-) dysrhythmias  Rhythm:Regular Rate:Normal - Systolic murmurs    Neuro/Psych  Headaches, negative psych ROS   GI/Hepatic neg GERD  ,(+)     (-) substance abuse  ,   Endo/Other  neg diabetes  Renal/GU negative Renal ROS     Musculoskeletal   Abdominal (+) + obese,   Peds  Hematology  (+) anemia ,   Anesthesia Other Findings Past Medical History: No date: Hypertension  Reproductive/Obstetrics                             Anesthesia Physical Anesthesia Plan  ASA: II  Anesthesia Plan: General   Post-op Pain Management:    Induction: Intravenous  PONV Risk Score and Plan: 3 and Ondansetron, Propofol infusion and TIVA  Airway Management Planned: Nasal Cannula  Additional Equipment: None  Intra-op Plan:   Post-operative Plan:   Informed Consent: I have reviewed the patients History and Physical, chart, labs and discussed the procedure including the risks, benefits and alternatives for the proposed anesthesia with the patient or authorized representative who has indicated his/her understanding and acceptance.     Dental advisory given  Plan Discussed with: CRNA and Surgeon  Anesthesia Plan Comments: (Discussed risks of anesthesia with  patient, including possibility of difficulty with spontaneous ventilation under anesthesia necessitating airway intervention, PONV, and rare risks such as cardiac or respiratory or neurological events. Patient understands.)        Anesthesia Quick Evaluation

## 2020-02-27 NOTE — Anesthesia Procedure Notes (Signed)
Date/Time: 02/27/2020 8:15 AM Performed by: Allean Found, CRNA Pre-anesthesia Checklist: Patient identified, Emergency Drugs available, Suction available, Timeout performed and Patient being monitored Oxygen Delivery Method: Nasal cannula Placement Confirmation: positive ETCO2

## 2020-04-09 ENCOUNTER — Ambulatory Visit
Admission: RE | Admit: 2020-04-09 | Discharge: 2020-04-09 | Disposition: A | Discharge status: Home / Self Care | Payer: BC Managed Care – PPO | Source: Ambulatory Visit | Attending: Internal Medicine | Admitting: Internal Medicine

## 2020-04-09 ENCOUNTER — Other Ambulatory Visit: Payer: Self-pay

## 2020-04-09 DIAGNOSIS — Z1231 Encounter for screening mammogram for malignant neoplasm of breast: Secondary | ICD-10-CM | POA: Diagnosis present

## 2021-02-18 ENCOUNTER — Other Ambulatory Visit: Payer: Self-pay | Admitting: Internal Medicine

## 2021-02-18 DIAGNOSIS — Z1231 Encounter for screening mammogram for malignant neoplasm of breast: Secondary | ICD-10-CM

## 2021-04-15 ENCOUNTER — Ambulatory Visit
Admission: RE | Admit: 2021-04-15 | Discharge: 2021-04-15 | Disposition: A | Discharge status: Home / Self Care | Payer: BC Managed Care – PPO | Source: Ambulatory Visit | Attending: Internal Medicine | Admitting: Internal Medicine

## 2021-04-15 DIAGNOSIS — Z1231 Encounter for screening mammogram for malignant neoplasm of breast: Secondary | ICD-10-CM | POA: Insufficient documentation

## 2022-04-07 ENCOUNTER — Other Ambulatory Visit: Payer: Self-pay | Admitting: Internal Medicine

## 2022-04-07 DIAGNOSIS — Z1231 Encounter for screening mammogram for malignant neoplasm of breast: Secondary | ICD-10-CM

## 2022-05-19 ENCOUNTER — Ambulatory Visit
Admission: RE | Admit: 2022-05-19 | Discharge: 2022-05-19 | Disposition: A | Discharge status: Home / Self Care | Payer: BC Managed Care – PPO | Source: Ambulatory Visit | Attending: Internal Medicine | Admitting: Internal Medicine

## 2022-05-19 DIAGNOSIS — Z1231 Encounter for screening mammogram for malignant neoplasm of breast: Secondary | ICD-10-CM | POA: Insufficient documentation

## 2023-04-25 ENCOUNTER — Other Ambulatory Visit: Payer: Self-pay | Admitting: Internal Medicine

## 2023-04-25 DIAGNOSIS — Z1231 Encounter for screening mammogram for malignant neoplasm of breast: Secondary | ICD-10-CM

## 2023-05-25 ENCOUNTER — Encounter (HOSPITAL_COMMUNITY): Payer: Self-pay

## 2023-06-01 ENCOUNTER — Encounter

## 2023-06-20 IMAGING — MG MM DIGITAL SCREENING BILAT W/ TOMO AND CAD
6 of 10 series · 6 of 30 positions shown · non-contrast
Comparison: Previous exam(s).

CLINICAL DATA: Screening.

EXAM:
DIGITAL SCREENING BILATERAL MAMMOGRAM WITH TOMOSYNTHESIS AND CAD
TECHNIQUE: Bilateral screening digital craniocaudal and mediolateral oblique
mammograms were obtained. Bilateral screening digital breast
tomosynthesis was performed. The images were evaluated with
computer-aided detection.

[L MLO synth-2D]
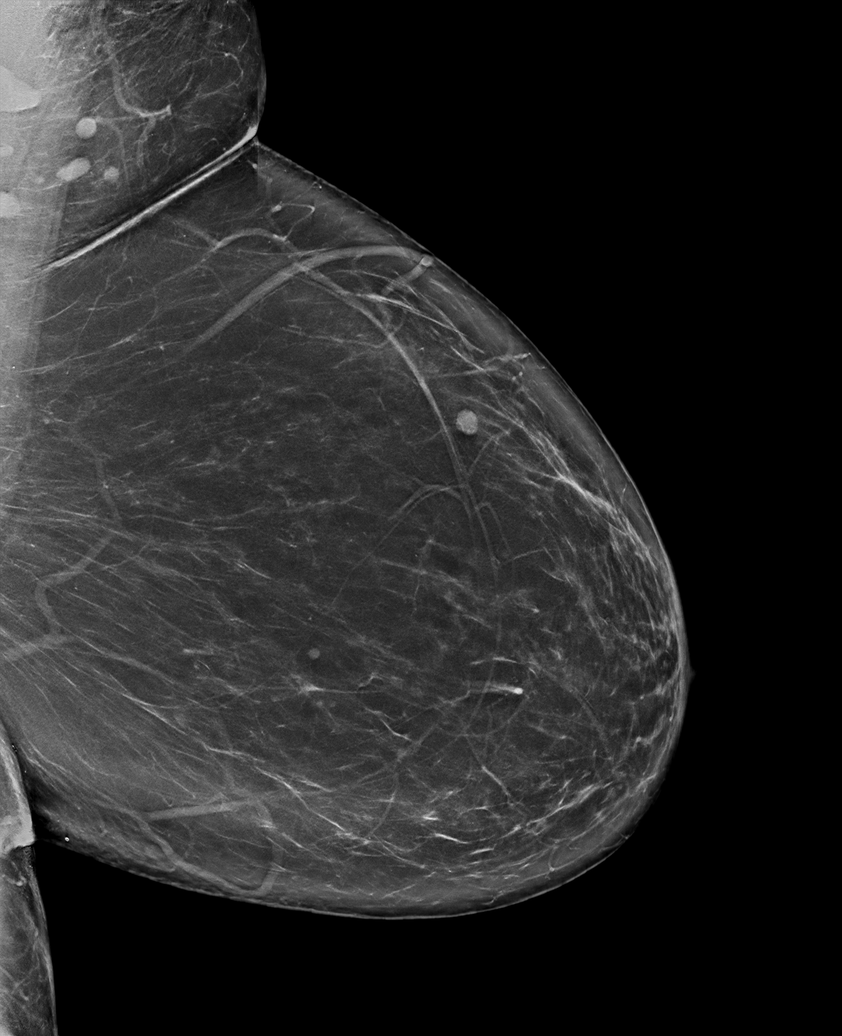

[R MLO synth-2D (1 of 2)]
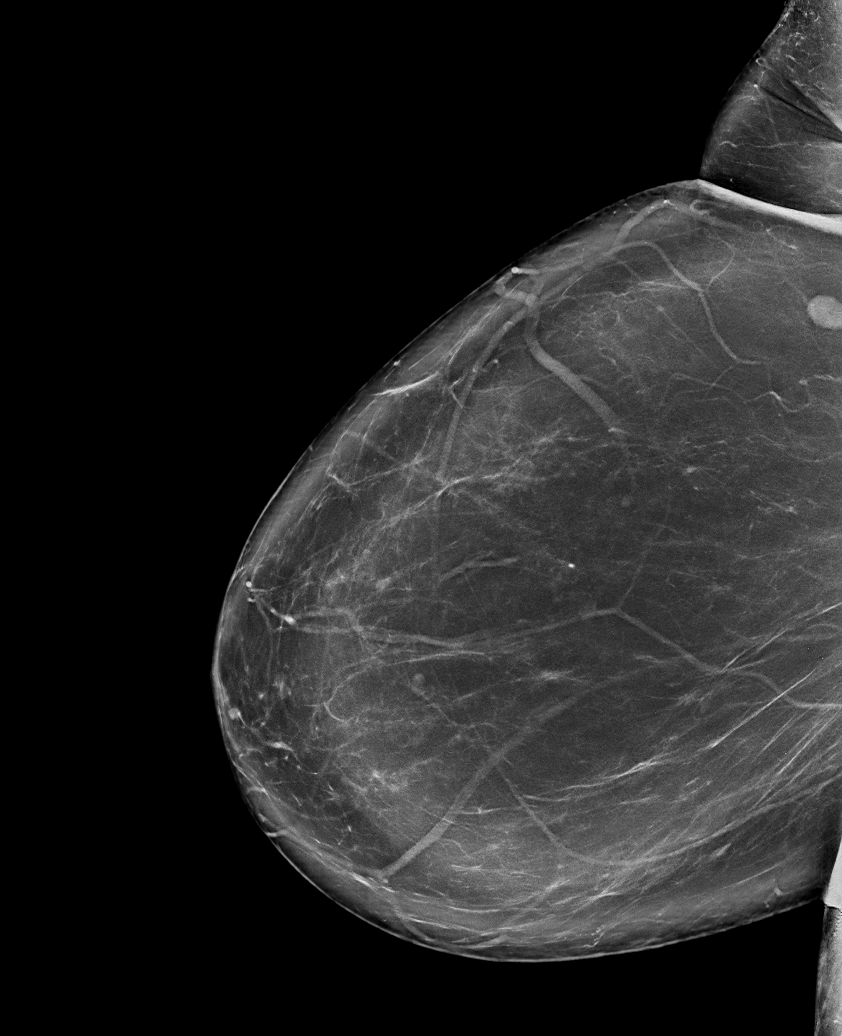

[R MLO synth-2D (2 of 2)]
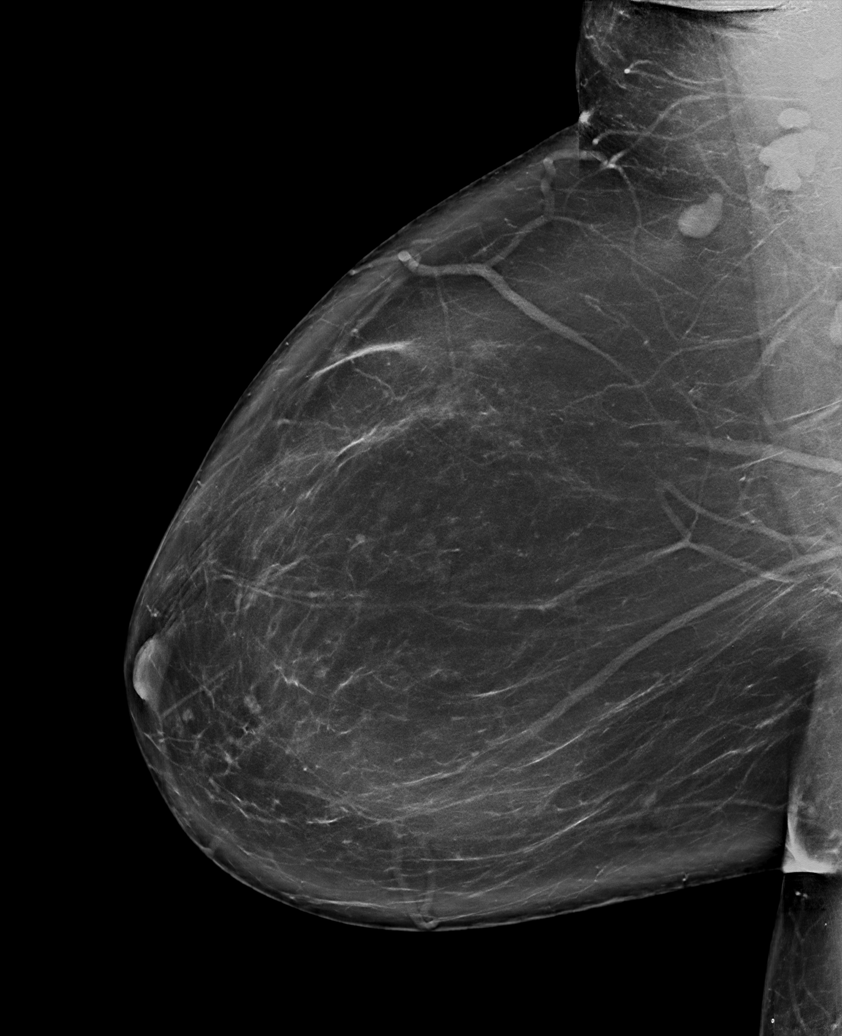

[R CC synth-2D]
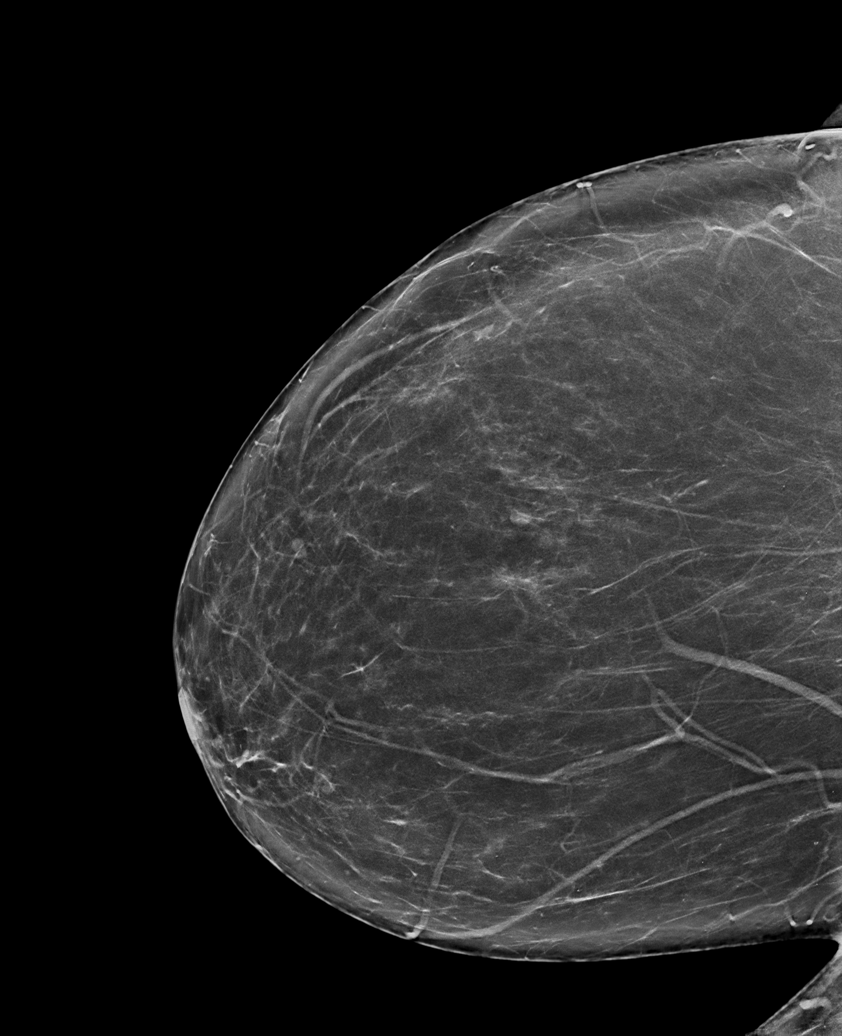

[L CC synth-2D]
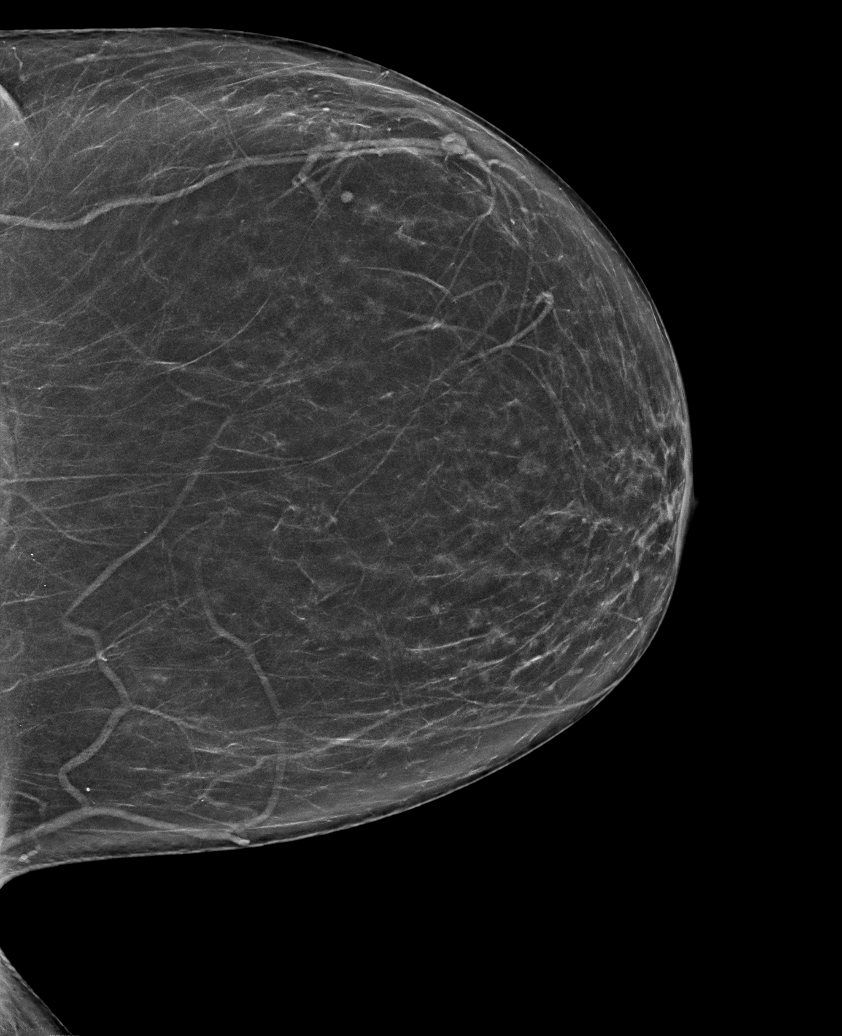

[R MLO tomo · tomo slice 53/105.0]
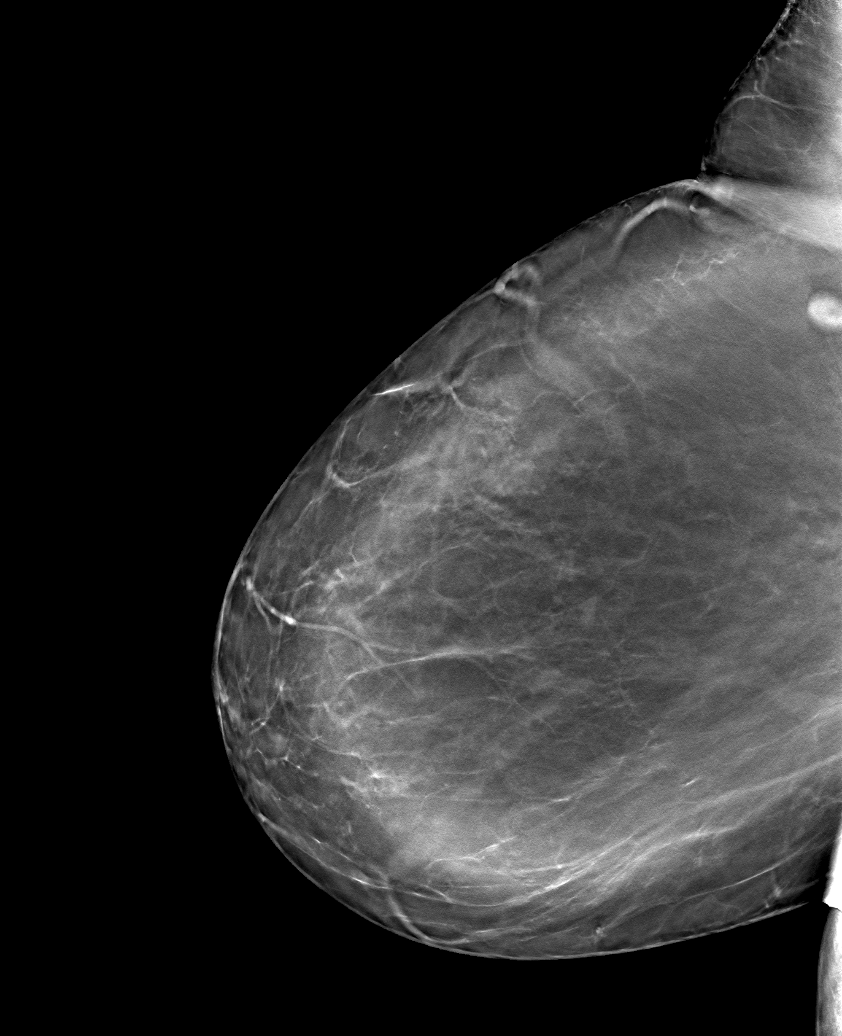

[6 of 30 positions shown; findings below may reference images not displayed]

ACR Breast Density Category b: There are scattered areas of
fibroglandular density.
FINDINGS: There are no findings suspicious for malignancy.
IMPRESSION: No mammographic evidence of malignancy. A result letter of this
screening mammogram will be mailed directly to the patient.

RECOMMENDATION:
Screening mammogram in one year. (Code:51-O-LD2)

BI-RADS CATEGORY  1: Negative.

## 2023-07-13 ENCOUNTER — Ambulatory Visit
Admission: RE | Admit: 2023-07-13 | Discharge: 2023-07-13 | Disposition: A | Discharge status: Home / Self Care | Payer: Self-pay | Source: Ambulatory Visit | Attending: Internal Medicine | Admitting: Internal Medicine

## 2023-07-13 DIAGNOSIS — Z1231 Encounter for screening mammogram for malignant neoplasm of breast: Secondary | ICD-10-CM | POA: Insufficient documentation

## 2023-08-24 ENCOUNTER — Other Ambulatory Visit: Payer: Self-pay

## 2023-08-24 ENCOUNTER — Emergency Department

## 2023-08-24 ENCOUNTER — Emergency Department
Admission: EM | Admit: 2023-08-24 | Discharge: 2023-08-24 | Disposition: A | Discharge status: Home / Self Care | Attending: Emergency Medicine | Admitting: Emergency Medicine

## 2023-08-24 DIAGNOSIS — I10 Essential (primary) hypertension: Secondary | ICD-10-CM | POA: Diagnosis not present

## 2023-08-24 DIAGNOSIS — Z79899 Other long term (current) drug therapy: Secondary | ICD-10-CM | POA: Diagnosis not present

## 2023-08-24 DIAGNOSIS — R42 Dizziness and giddiness: Secondary | ICD-10-CM | POA: Diagnosis present

## 2023-08-24 DIAGNOSIS — E876 Hypokalemia: Secondary | ICD-10-CM | POA: Diagnosis not present

## 2023-08-24 LAB — COMPREHENSIVE METABOLIC PANEL WITH GFR
ALT: 22 U/L (ref 0–44)
AST: 21 U/L (ref 15–41)
Albumin: 4 g/dL (ref 3.5–5.0)
Alkaline Phosphatase: 62 U/L (ref 38–126)
Anion gap: 11 (ref 5–15)
BUN: 15 mg/dL (ref 6–20)
CO2: 23 mmol/L (ref 22–32)
Calcium: 9.2 mg/dL (ref 8.9–10.3)
Chloride: 105 mmol/L (ref 98–111)
Creatinine, Ser: 0.67 mg/dL (ref 0.44–1.00)
GFR, Estimated: >60 mL/min (ref >60)
Glucose, Bld: 97 mg/dL (ref 70–99)
Potassium: 3.3 mmol/L — ABNORMAL LOW (ref 3.5–5.1)
Sodium: 139 mmol/L (ref 135–145)
Total Bilirubin: 0.8 mg/dL (ref 0.0–1.2)
Total Protein: 8.2 g/dL — ABNORMAL HIGH (ref 6.5–8.1)

## 2023-08-24 LAB — CBC
HCT: 39.3 % (ref 36.0–46.0)
Hemoglobin: 12.9 g/dL (ref 12.0–15.0)
MCH: 26 pg (ref 26.0–34.0)
MCHC: 32.8 g/dL (ref 30.0–36.0)
MCV: 79.2 fL — ABNORMAL LOW (ref 80.0–100.0)
Platelets: 261 K/uL (ref 150–400)
RBC: 4.96 MIL/uL (ref 3.87–5.11)
RDW: 14.3 % (ref 11.5–15.5)
WBC: 5.3 K/uL (ref 4.0–10.5)
nRBC: 0 % (ref 0.0–0.2)

## 2023-08-24 LAB — URINALYSIS, ROUTINE W REFLEX MICROSCOPIC
Bilirubin Urine: NEGATIVE
Glucose, UA: NEGATIVE mg/dL
Ketones, ur: NEGATIVE mg/dL
Nitrite: NEGATIVE
Protein, ur: NEGATIVE mg/dL
Specific Gravity, Urine: 1.005 (ref 1.005–1.030)
pH: 6 (ref 5.0–8.0)

## 2023-08-24 MED ORDER — POTASSIUM CHLORIDE CRYS ER 10 MEQ PO TBCR: 10.0000 meq | EXTENDED_RELEASE_TABLET | Freq: Every day | ORAL | 0 refills | Status: AC

## 2023-08-24 MED ORDER — FLUTICASONE PROPIONATE 50 MCG/ACT NA SUSP: 2.0000 | Freq: Every day | NASAL | 1 refills | Status: AC

## 2023-08-24 MED ORDER — DIAZEPAM 2 MG PO TABS: 2.0000 mg | ORAL_TABLET | Freq: Three times a day (TID) | ORAL | 0 refills | Status: AC | PRN

## 2023-08-24 NOTE — Progress Notes (Signed)
 Patient presents to triage with complaints of persistent/recurrent dizziness.  Seen by PCP recently, referred to ENT.  Meclizine  ineffective.  No prior imaging identified in EMR.  Blood pressure elevated.  To ED for further evaluation and care.

## 2023-08-24 NOTE — ED Triage Notes (Signed)
 Pt comes with dizziness that started Sunday while at church. Pt states dizziness while even sitting. Pt states she is on BP meds. Pt states some blurry vision sometimes. Pt states she just feels off balance and in a fog.

## 2023-08-24 NOTE — ED Provider Notes (Signed)
 [facility] Provider Note    Event Date/Time   First MD Initiated Contact with Patient 08/24/23 1233     (approximate)   History   Dizziness   HPI  Martha Decker is a 53 y.o. female   presents to the ED with complaint of dizziness that actually has been going on intermittently for several months but started again Sunday while at church.  Patient has infrequently taken meclizine  and only takes it as she needs it for dizziness.  She states that this has not helped with her symptoms.  She also reports some blurred vision at times and feeling that she is off balance.  Patient has a history of hypertension, posttraumatic headache, anemia and benign paroxysmal positional vertigo.      Physical Exam   Triage Vital Signs: ED Triage Vitals  Encounter Vitals Group     BP 08/24/23 1213 (!) 163/103     Girls Systolic BP Percentile --      Girls Diastolic BP Percentile --      Boys Systolic BP Percentile --      Boys Diastolic BP Percentile --      Pulse Rate 08/24/23 1213 99     Resp 08/24/23 1213 18     Temp 08/24/23 1213 97.8 F (36.6 C)     Temp src --      SpO2 08/24/23 1213 100 %     Weight 08/24/23 1212 230 lb (104.3 kg)     Height 08/24/23 1212 5' 4 (1.626 m)     Head Circumference --      Peak Flow --      Pain Score 08/24/23 1212 0     Pain Loc --      Pain Education --      Exclude from Growth Chart --     Most recent vital signs: Vitals:   08/24/23 1213 08/24/23 1343  BP: (!) 163/103 (!) 142/82  Pulse: 99 68  Resp: 18 13  Temp: 97.8 F (36.6 C) 97.9 F (36.6 C)  SpO2: 100% 100%     General: Awake, no distress.  Alert, talkative. CV:  Good peripheral perfusion.  Heart regular rate rhythm. Resp:  Normal effort.  Lungs are clear. Abd:  No distention.  Other:  PERRLA, EOMI's, cranial nerves II through XII grossly intact.  EACs are clear.  TMs are dull but no erythema or injection is noted.  Neck is supple without cervical  lymphadenopathy.  Speech is normal.   ED Results / Procedures / Treatments   Labs (all labs ordered are listed, but only abnormal results are displayed) Labs Reviewed  COMPREHENSIVE METABOLIC PANEL WITH GFR - Abnormal; Notable for the following components:      Result Value   Potassium 3.3 (*)    Total Protein 8.2 (*)    All other components within normal limits  CBC - Abnormal; Notable for the following components:   MCV 79.2 (*)    All other components within normal limits  URINALYSIS, ROUTINE W REFLEX MICROSCOPIC - Abnormal; Notable for the following components:   Color, Urine YELLOW (*)    APPearance CLOUDY (*)    Hgb urine dipstick SMALL (*)    Leukocytes,Ua TRACE (*)    Bacteria, UA RARE (*)    All other components within normal limits  CBG MONITORING, ED     EKG  Vent. rate 82 BPM PR interval 162 ms QRS duration 84 ms QT/QTcB 396/462 ms P-R-T axes 37 -  23 56 Normal sinus rhythm Possible Anterior infarct , age undetermined Abnormal ECG When compared with ECG of 15-Jul-2018 13:33, No significant change was found    RADIOLOGY CT head per radiology is negative for acute intracranial changes.    PROCEDURES:  Critical Care performed:   Procedures   MEDICATIONS ORDERED IN ED: Medications - No data to display   IMPRESSION / MDM / ASSESSMENT AND PLAN / ED COURSE  I reviewed the triage vital signs and the nursing notes.   Differential diagnosis includes, but is not limited to, vertigo, eustachian tube dysfunction, continued paroxysmal positional vertigo, otitis media, serous otitis, CVA considered, arrhythmia considered.  53 year old female presents to the ED with complaint of dizziness that started off known for a couple of months but more recently approximately 5 days ago while she was at church.  Patient was made aware that her CT scan was negative and reassured with her EKG.  Patient has had problems with positional vertigo in the past.  She has been  taking meclizine  infrequently and reports that it does not seem to be helping.  With lab work reassuring and blood pressure was rechecked prior to discharge at 142/82 patient is encouraged to continue with meclizine  every 6 hours, Flonase  nasal spray each day, potassium chloride  10 mEq daily for the next 7 days and diazepam  every 8 hours as needed when vertigo is worse.  Patient is aware that when she is taking the diazepam  she should not be driving or operating any machinery as it could cause drowsiness and increase her risk for injury.  She has to follow-up with her primary care and also Dr. Rumalda who is on-call for [facility] for further investigation and evaluation of her dizziness.      Patient's presentation is most consistent with acute presentation with potential threat to life or bodily function.  FINAL CLINICAL IMPRESSION(S) / ED DIAGNOSES   Final diagnoses:  Dizziness  Hypokalemia     Rx / DC Orders   ED Discharge Orders          Ordered    fluticasone  (FLONASE ) 50 MCG/ACT nasal spray  Daily        08/24/23 1350    diazepam  (VALIUM ) 2 MG tablet  Every 8 hours PRN        08/24/23 1350    potassium chloride  (KLOR-CON  M) 10 MEQ tablet  Daily        08/24/23 1351             Note:  This document was prepared using Dragon voice recognition software and may include unintentional dictation errors.   Saunders Shona CROME, PA-C 08/24/23 1439    Claudene Rover, MD 08/24/23 [phone]

## 2023-08-24 NOTE — ED Triage Notes (Signed)
 First nurse note: Pt to ED via POV from [facility]. Pt reports dizziness and feeling off balance x5 days. BP 174/98. HR 103

## 2023-08-24 NOTE — Discharge Instructions (Signed)
 Call make an appointment with Dr. Rumalda who is on-call for [facility] for follow-up of your dizziness.  A prescription for Flonase  nasal spray and diazepam  was sent to the pharmacy to see if this helps with your dizziness.  You may also continue taking meclizine  every 6-8 hours for dizziness.  A short-term course of potassium was also sent to the pharmacy as you were slightly decreased in looking at your blood work as we talked about.  You should also follow-up with your primary care provider.  Your blood pressure prior to discharge was great at 142/82.
